# Patient Record
Sex: Female | Born: 1970 | Race: White | Hispanic: No | Marital: Married | Smoking: Never smoker
Health system: Southern US, Community
[De-identification: ages and names within clinical notes are randomized; demographics above are authoritative.]

## PROBLEM LIST (undated history)

## (undated) DIAGNOSIS — N814 Uterovaginal prolapse, unspecified: Secondary | ICD-10-CM

## (undated) DIAGNOSIS — D259 Leiomyoma of uterus, unspecified: Secondary | ICD-10-CM

## (undated) HISTORY — PX: TONSILLECTOMY: SUR1361

## (undated) HISTORY — PX: ABDOMINAL HYSTERECTOMY: SHX81

---

## 2019-04-09 ENCOUNTER — Emergency Department (HOSPITAL_BASED_OUTPATIENT_CLINIC_OR_DEPARTMENT_OTHER): Payer: 59

## 2019-04-09 ENCOUNTER — Emergency Department (HOSPITAL_BASED_OUTPATIENT_CLINIC_OR_DEPARTMENT_OTHER)
Admission: EM | Admit: 2019-04-09 | Discharge: 2019-04-09 | Disposition: A | Discharge status: Home / Self Care | Payer: 59 | Attending: Emergency Medicine | Admitting: Emergency Medicine

## 2019-04-09 ENCOUNTER — Other Ambulatory Visit: Payer: Self-pay

## 2019-04-09 ENCOUNTER — Encounter (HOSPITAL_BASED_OUTPATIENT_CLINIC_OR_DEPARTMENT_OTHER): Payer: Self-pay | Admitting: Emergency Medicine

## 2019-04-09 DIAGNOSIS — Z888 Allergy status to other drugs, medicaments and biological substances status: Secondary | ICD-10-CM | POA: Insufficient documentation

## 2019-04-09 DIAGNOSIS — K8020 Calculus of gallbladder without cholecystitis without obstruction: Secondary | ICD-10-CM | POA: Insufficient documentation

## 2019-04-09 DIAGNOSIS — R1011 Right upper quadrant pain: Secondary | ICD-10-CM | POA: Insufficient documentation

## 2019-04-09 DIAGNOSIS — R1013 Epigastric pain: Secondary | ICD-10-CM | POA: Diagnosis present

## 2019-04-09 HISTORY — DX: Leiomyoma of uterus, unspecified: D25.9

## 2019-04-09 HISTORY — DX: Uterovaginal prolapse, unspecified: N81.4

## 2019-04-09 LAB — URINALYSIS, ROUTINE W REFLEX MICROSCOPIC
Bilirubin Urine: NEGATIVE
Glucose, UA: NEGATIVE mg/dL
Ketones, ur: >80 mg/dL — AB
Leukocytes,Ua: NEGATIVE
Nitrite: NEGATIVE
Protein, ur: NEGATIVE mg/dL
Specific Gravity, Urine: 1.02 (ref 1.005–1.030)
pH: 6 (ref 5.0–8.0)

## 2019-04-09 LAB — CBC WITH DIFFERENTIAL/PLATELET
Abs Immature Granulocytes: 0.04 10*3/uL (ref 0.00–0.07)
Basophils Absolute: 0 10*3/uL (ref 0.0–0.1)
Basophils Relative: 0 %
Eosinophils Absolute: 0 10*3/uL (ref 0.0–0.5)
Eosinophils Relative: 0 %
HCT: 40.6 % (ref 36.0–46.0)
Hemoglobin: 13.3 g/dL (ref 12.0–15.0)
Immature Granulocytes: 0 %
Lymphocytes Relative: 11 %
Lymphs Abs: 1.3 10*3/uL (ref 0.7–4.0)
MCH: 28.4 pg (ref 26.0–34.0)
MCHC: 32.8 g/dL (ref 30.0–36.0)
MCV: 86.8 fL (ref 80.0–100.0)
Monocytes Absolute: 0.6 10*3/uL (ref 0.1–1.0)
Monocytes Relative: 5 %
Neutro Abs: 10.1 10*3/uL — ABNORMAL HIGH (ref 1.7–7.7)
Neutrophils Relative %: 84 %
Platelets: 261 10*3/uL (ref 150–400)
RBC: 4.68 MIL/uL (ref 3.87–5.11)
RDW: 12.9 % (ref 11.5–15.5)
WBC: 12 10*3/uL — ABNORMAL HIGH (ref 4.0–10.5)
nRBC: 0 % (ref 0.0–0.2)

## 2019-04-09 LAB — COMPREHENSIVE METABOLIC PANEL
ALT: 16 U/L (ref 0–44)
AST: 24 U/L (ref 15–41)
Albumin: 4.1 g/dL (ref 3.5–5.0)
Alkaline Phosphatase: 69 U/L (ref 38–126)
Anion gap: 8 (ref 5–15)
BUN: 13 mg/dL (ref 6–20)
CO2: 19 mmol/L — ABNORMAL LOW (ref 22–32)
Calcium: 9.4 mg/dL (ref 8.9–10.3)
Chloride: 109 mmol/L (ref 98–111)
Creatinine, Ser: 0.7 mg/dL (ref 0.44–1.00)
GFR calc Af Amer: >60 mL/min (ref >60)
GFR calc non Af Amer: >60 mL/min (ref >60)
Glucose, Bld: 126 mg/dL — ABNORMAL HIGH (ref 70–99)
Potassium: 3.5 mmol/L (ref 3.5–5.1)
Sodium: 136 mmol/L (ref 135–145)
Total Bilirubin: 0.8 mg/dL (ref 0.3–1.2)
Total Protein: 7.3 g/dL (ref 6.5–8.1)

## 2019-04-09 LAB — LIPASE, BLOOD: Lipase: 26 U/L (ref 11–51)

## 2019-04-09 LAB — URINALYSIS, MICROSCOPIC (REFLEX)

## 2019-04-09 LAB — PREGNANCY, URINE: Preg Test, Ur: NEGATIVE

## 2019-04-09 MED ORDER — ONDANSETRON HCL 4 MG/2ML IJ SOLN
4.0000 mg | Freq: Once | INTRAMUSCULAR | Status: AC
  Administered 2019-04-09: 4 mg via INTRAVENOUS
  Filled 2019-04-09: qty 2

## 2019-04-09 MED ORDER — ONDANSETRON HCL 4 MG PO TABS: 4.0000 mg | ORAL_TABLET | Freq: Three times a day (TID) | ORAL | 0 refills | Status: AC | PRN

## 2019-04-09 MED ORDER — ONDANSETRON HCL 4 MG/2ML IJ SOLN: 4.0000 mg | Freq: Four times a day (QID) | INTRAMUSCULAR | Status: DC | PRN

## 2019-04-09 MED ORDER — SODIUM CHLORIDE 0.9 % IV BOLUS: 1000.0000 mL | Freq: Once | INTRAVENOUS | Status: DC

## 2019-04-09 MED ORDER — OXYCODONE-ACETAMINOPHEN 5-325 MG PO TABS: 1.0000 | ORAL_TABLET | Freq: Four times a day (QID) | ORAL | 0 refills | Status: DC | PRN

## 2019-04-09 MED ORDER — FENTANYL CITRATE (PF) 100 MCG/2ML IJ SOLN
50.0000 ug | Freq: Once | INTRAMUSCULAR | Status: AC
  Administered 2019-04-09: 50 ug via INTRAVENOUS
  Filled 2019-04-09: qty 2

## 2019-04-09 MED ORDER — IOHEXOL 300 MG/ML  SOLN
100.0000 mL | Freq: Once | INTRAMUSCULAR | Status: AC | PRN
  Administered 2019-04-09: 100 mL via INTRAVENOUS

## 2019-04-09 MED ORDER — SODIUM CHLORIDE 0.9 % IV BOLUS
1000.0000 mL | Freq: Once | INTRAVENOUS | Status: AC
  Administered 2019-04-09: 1000 mL via INTRAVENOUS

## 2019-04-09 NOTE — ED Notes (Signed)
Patient transported to CT 

## 2019-04-09 NOTE — ED Triage Notes (Signed)
C/o sudden onset epigastric pain x ~12 hours with vomiting. Last episode of emesis ~10 hours ago. Denies having pain like this previously. Denies diarrhea/constipation.

## 2019-04-09 NOTE — ED Notes (Signed)
Pt verbalized understanding of dc instructions.

## 2019-04-09 NOTE — Discharge Instructions (Signed)
You were seen in the emergency department for upper abdominal pain and vomiting.  You had lab work CAT scan and ultrasound which showed that you had gallstones and gallbladder thickening.  This will likely require surgery.  Please contact [facility] for close follow-up.  If you experience fever or your pain does not resolve within 1 to 2 hours please return to the emergency department.

## 2019-04-09 NOTE — ED Provider Notes (Signed)
[facility] EMERGENCY DEPARTMENT Provider Note   CSN: [id number] Arrival date & time: 04/09/19  [id number]     History   Chief Complaint Chief Complaint  Patient presents with   Abdominal Pain    HPI Rebekah Garcia is a 48 y.o. female. She is presenting complaining of acute onset of subxiphoid abdominal pain that started about 12 hours ago. Initially was very nauseous and vomited multiple times. Pain was 10 out of 10 at onset. Subsiding somewhat. Decreased nausea. No fevers chills cough shortness of breath chest pain diarrhea constipation or urinary symptoms.     The history is provided by the patient.  Abdominal Pain Pain location:  Epigastric Pain quality: cramping   Pain radiates to:  Does not radiate Pain severity:  Severe Onset quality:  Sudden Duration:  12 hours Timing:  Constant Progression:  Improving Chronicity:  New Context: not previous surgeries, not recent illness, not recent travel and not trauma   Relieved by:  Nothing Worsened by:  Nothing Ineffective treatments:  Position changes Associated symptoms: nausea and vomiting   Associated symptoms: no chest pain, no chills, no constipation, no cough, no diarrhea, no dysuria, no fever, no hematemesis, no hematuria, no melena, no shortness of breath and no sore throat     Past Medical History:  Diagnosis Date   Uterine fibroid    Uterine prolapse     There are no active problems to display for this patient.   History reviewed. No pertinent surgical history.   OB History   No obstetric history on file.      Home Medications    Prior to Admission medications   Medication Sig Start Date End Date Taking? Authorizing Provider  Multiple Vitamin (MULTIVITAMIN) tablet Take 1 tablet by mouth daily.   Yes [provider]    Family History No family history on file.  Social History Social History   Tobacco Use   Smoking status: Never Smoker   Smokeless tobacco: Never Used    Substance Use Topics   Alcohol use: Never    Frequency: Never   Drug use: Never     Allergies   Octacaine   Review of Systems Review of Systems  Constitutional: Negative for chills and fever.  HENT: Negative for sore throat.   Eyes: Negative for visual disturbance.  Respiratory: Negative for cough and shortness of breath.   Cardiovascular: Negative for chest pain.  Gastrointestinal: Positive for abdominal pain, nausea and vomiting. Negative for constipation, diarrhea, hematemesis and melena.  Genitourinary: Negative for dysuria and hematuria.  Musculoskeletal: Negative for back pain.  Skin: Negative for rash.  Neurological: Negative for headaches.     Physical Exam Updated Vital Signs BP 114/71 (BP Location: Right Arm)    Pulse 85    Temp 98.3 F (36.8 C) (Oral)    Resp 20    Ht 5\' 4"  (1.626 m)    Wt 63.5 kg    LMP 04/09/2019    SpO2 100%    BMI 24.03 kg/m   Physical Exam Vitals signs and nursing note reviewed.  Constitutional:      General: She is not in acute distress.    Appearance: She is well-developed.  HENT:     Head: Normocephalic and atraumatic.  Eyes:     Conjunctiva/sclera: Conjunctivae normal.  Neck:     Musculoskeletal: Neck supple.  Cardiovascular:     Rate and Rhythm: Normal rate and regular rhythm.     Heart sounds: No murmur.  Pulmonary:  Effort: Pulmonary effort is normal. No respiratory distress.     Breath sounds: Normal breath sounds.  Abdominal:     Palpations: Abdomen is soft.     Tenderness: There is no abdominal tenderness. There is no guarding or rebound. Negative signs include Murphy's sign.  Musculoskeletal: Normal range of motion.     Right lower leg: No edema.     Left lower leg: No edema.  Skin:    General: Skin is warm and dry.     Capillary Refill: Capillary refill takes less than 2 seconds.  Neurological:     General: No focal deficit present.     Mental Status: She is alert.      ED Treatments / Results   Labs (all labs ordered are listed, but only abnormal results are displayed) Labs Reviewed  CBC WITH DIFFERENTIAL/PLATELET - Abnormal; Notable for the following components:      Result Value   WBC 12.0 (*)    Neutro Abs 10.1 (*)    All other components within normal limits  COMPREHENSIVE METABOLIC PANEL - Abnormal; Notable for the following components:   CO2 19 (*)    Glucose, Bld 126 (*)    All other components within normal limits  URINALYSIS, ROUTINE W REFLEX MICROSCOPIC - Abnormal; Notable for the following components:   Hgb urine dipstick SMALL (*)    Ketones, ur >80 (*)    All other components within normal limits  URINALYSIS, MICROSCOPIC (REFLEX) - Abnormal; Notable for the following components:   Bacteria, UA MANY (*)    All other components within normal limits  LIPASE, BLOOD  PREGNANCY, URINE    EKG EKG Interpretation  Date/Time:  Sunday April 09 2019 06:46:00 EST Ventricular Rate:  81 PR Interval:    QRS Duration: 86 QT Interval:  407 QTC Calculation: 473 R Axis:   82 Text Interpretation: Sinus rhythm Right atrial enlargement Confirmed by Thayer Jew [phone]) on 04/09/2019 6:48:14 AM   Radiology Ct Abdomen Pelvis W Contrast  Result Date: 04/09/2019 CLINICAL DATA:  Epigastric pain with nausea and vomiting EXAM: CT ABDOMEN AND PELVIS WITH CONTRAST TECHNIQUE: Multidetector CT imaging of the abdomen and pelvis was performed using the standard protocol following bolus administration of intravenous contrast. CONTRAST:  170mL OMNIPAQUE IOHEXOL 300 MG/ML  SOLN COMPARISON:  None. FINDINGS: Lower chest: Lung bases are clear. Hepatobiliary: No focal liver lesions are evident. The gallbladder wall appears thickened with apparent pericholecystic fluid. No gallstones are appreciable by CT. There is no appreciable biliary duct dilatation. Pancreas: There is no pancreatic mass or inflammatory focus. Spleen: No splenic lesions are evident. Adrenals/Urinary Tract: Adrenals  bilaterally appear normal. There is a cyst in the upper pole of the left kidney measuring 1.5 x 1.3 cm. There is a 5 mm cyst in the posterior mid right kidney. There is a 7 mm cyst in the lower pole left kidney. There is no appreciable hydronephrosis on either side. There is a 1 mm calculus in the lower pole of the right kidney. There is no appreciable ureteral calculus on either side. Urinary bladder is midline with wall thickness within normal limits. Stomach/Bowel: There is no appreciable bowel wall or mesenteric thickening. Terminal ileum appears normal. There is no evident bowel obstruction. There is no free air or portal venous air. Vascular/Lymphatic: There is no abdominal aortic aneurysm. No vascular lesions are evident. There is no adenopathy in the abdomen or pelvis. Reproductive: Uterus is anteverted. Uterus is enlarged diffusely measuring 12.3 x 9.7 x 10.8  cm. There are mass lesions throughout the uterus consistent with diffuse leiomyomatous change. There is prominent periuterine vascularity. There is a follicle in the right ovary measuring 1.7 x 1.6 cm. Beyond this follicle, there is no extrauterine pelvic mass. Other: Appendix appears normal. No evident abscess or ascites in the abdomen or pelvis. Musculoskeletal: There is a hemangioma in the L4 vertebral body. No blastic or lytic bone lesions evident. No intramuscular or abdominal wall lesion. IMPRESSION: 1. Apparent gallbladder wall thickening and pericholecystic fluid. A degree of cholecystitis is suspected. Correlation with gallbladder ultrasound advised. 2. Enlarged uterus with diffuse leiomyomatous change within the uterus. 3. Prominent periuterine vascularity. Question a degree of pelvic congestion syndrome. 4. Nonobstructing 1 mm calculus lower pole left kidney. No hydronephrosis or ureteral calculus on either side. Urinary bladder wall thickness normal. 5. Appendix appears normal. No bowel obstruction. No abscess in the abdomen or pelvis.  Electronically Signed   By: Lowella Grip III M.D.   On: 04/09/2019 09:16   US Abdomen Limited Ruq  Result Date: 04/09/2019 CLINICAL DATA:  Pain with nausea and vomiting EXAM: ULTRASOUND ABDOMEN LIMITED RIGHT UPPER QUADRANT COMPARISON:  CT abdomen and pelvis April 09, 2019 FINDINGS: Gallbladder: Within the gallbladder there are echogenic foci which move and shadow consistent with cholelithiasis. Largest gallstone measures 1.5 cm in length. There is gallbladder wall thickening and mild pericholecystic fluid. Patient is tender over the gallbladder. Common bile duct: Diameter: 3 mm. No intrahepatic or extrahepatic biliary duct dilatation. Liver: No focal lesion identified. Within normal limits in parenchymal echogenicity. Portal vein is patent on color Doppler imaging with normal direction of blood flow towards the liver. Other: None. IMPRESSION: Findings indicative of acute cholecystitis. Study otherwise unremarkable. Electronically Signed   By: Lowella Grip III M.D.   On: 04/09/2019 09:51    Procedures Procedures (including critical care time)  Medications Ordered in ED Medications  sodium chloride 0.9 % bolus 1,000 mL (has no administration in time range)  ondansetron (ZOFRAN) injection 4 mg (has no administration in time range)  sodium chloride 0.9 % bolus 1,000 mL (has no administration in time range)  fentaNYL (SUBLIMAZE) injection 50 mcg (has no administration in time range)  ondansetron (ZOFRAN) injection 4 mg (has no administration in time range)     Initial Impression / Assessment and Plan / ED Course  I have reviewed the triage vital signs and the nursing notes.  Pertinent labs & imaging results that were available during my care of the patient were reviewed by me and considered in my medical decision making (see chart for details).  Clinical Course as of Apr 08 1718  Nancy Fetter Apr 09, 2019  07021 Healthy 48 year old female here with sudden onset of subxiphoid epigastric pain  vomiting. Differential includes gastritis, peptic ulcer disease, cholelithiasis, cholecystitis, obstruction, gastroenteritis. Less likely ACS.    [MB]  [id number] Patient CT showing evidence of gallbladder thickening.  I have ordered a right upper quadrant ultrasound and informed patient of results.  As far as the findings around her uterus she says she is aware of this and follows with GYN for it.   [MB]  1019 Discussed the case with Dr. Marlou Starks was along general surgery.  He said the patient could be admitted to his service for at [facility] long or she can be discharged and follow-up with them in the clinic if she so chooses.  Patient states she has some important things dated tomorrow so she would like to go home.  We talked about  indications for return.  Will prescribe her some pain medicine for discharge.   [MB]    Clinical Course User Index [MB] Hayden Rasmussen, MD        Final Clinical Impressions(s) / ED Diagnoses   Final diagnoses:  RUQ abdominal pain  Calculus of gallbladder without cholecystitis without obstruction    ED Discharge Orders         Ordered    oxyCODONE-acetaminophen (PERCOCET/ROXICET) 5-325 MG tablet  Every 6 hours PRN     04/09/19 1023    ondansetron (ZOFRAN) 4 MG tablet  Every 8 hours PRN     04/09/19 1023           Hayden Rasmussen, MD 04/09/19 1719

## 2019-04-20 ENCOUNTER — Ambulatory Visit: Payer: Self-pay | Admitting: Surgery

## 2019-04-20 NOTE — H&P (View-Only) (Signed)
Surgical H&P  CC: cholecystitis  HPI: this is a very pleasant and otherwise healthy 48 year old woman who presented to [facility] emergency room about 2 weeks ago with acute onset severe epigastric pain, nausea, workup was notable for acute calculus cholecystitis confirmed by ultrasound.  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  She was discharged with pain and nausea medication but no antibiotics by her report, and has been on a strict fat-free diet since then.  After 3 or 4 days her symptoms subsided, she is now not having any nausea or pain.  No prior abdominal surgeries.  Allergies  Allergen Reactions   Octacaine Anaphylaxis    Past Medical History:  Diagnosis Date   Uterine fibroid    Uterine prolapse     No past surgical history on file.  No family history on file.  Social History   Socioeconomic History   Marital status: Married    Spouse name: Not on file   Number of children: Not on file   Years of education: Not on file   Highest education level: Not on file  Occupational History   Not on file  Tobacco Use   Smoking status: Never Smoker   Smokeless tobacco: Never Used  Substance and Sexual Activity   Alcohol use: Never   Drug use: Never   Sexual activity: Not on file  Other Topics Concern   Not on file  Social History Narrative   Not on file   Social Determinants of Health   Financial Resource Strain:    Difficulty of Paying Living Expenses: Not on file  Food Insecurity:    Worried About [place] in the Last Year: Not on file   Ran Out of Food in the Last Year: Not on file  Transportation Needs:    Lack of Transportation (Medical): Not on file   Lack of Transportation (Non-Medical): Not on file  Physical Activity:    Days of Exercise per Week: Not on file   Minutes of Exercise per Session: Not on file  Stress:    Feeling of Stress : Not on file  Social Connections:    Frequency of Communication with Friends and Family: Not on file   Frequency of Social Gatherings with Friends and Family: Not on file   Attends Religious Services: Not on file   Active Member of Clubs or Organizations: Not on file   Attends Archivist Meetings: Not on file   Marital Status: Not on file    Current Outpatient Medications on File Prior to Visit  Medication Sig Dispense Refill   Multiple Vitamin (MULTIVITAMIN) tablet Take 1 tablet by mouth daily.     ondansetron (ZOFRAN) 4 MG tablet Take 1 tablet (4 mg total) by mouth every 8 (eight) hours as needed for nausea or vomiting. 15 tablet 0   oxyCODONE-acetaminophen (PERCOCET/ROXICET) 5-325 MG tablet Take 1-2 tablets by mouth every 6 (six) hours as needed for severe pain. 15 tablet 0   No current facility-administered medications on file prior to visit.    Review of Systems: a complete, 10pt review of systems was completed with pertinent positives and negatives as documented in the HPI  Physical Exam: Vitals  Weight: 135.2 lb   Height: 64 in  Body Surface Area: 1.66 m   Body Mass Index: 23.21 kg/m   Temp.: 98 F  Pulse: 102 (Regular)    BP: 124/86 (Sitting, Left Arm, Standard)  Gen: alert and well appearing Eye: extraocular motion intact, no scleral icterus ENT: moist mucus membranes, dentition intact Neck: no mass or thyromegaly Chest: unlabored respirations, symmetrical air entry, clear bilaterally CV: regular rate and rhythm, no pedal edema Abdomen: soft, nontender, nondistended. No mass or organomegaly MSK: strength symmetrical throughout, no deformity Neuro: grossly intact, normal gait Psych: normal mood and affect, appropriate insight Skin: warm and dry, no rash or lesion on limited exam   CBC Latest Ref Rng & Units 04/09/2019  WBC 4.0 - 10.5 K/uL 12.0(H)  Hemoglobin 12.0 - 15.0 g/dL 13.3  Hematocrit 36.0 - 46.0 % 40.6  Platelets 150 - 400  K/uL 261    CMP Latest Ref Rng & Units 04/09/2019  Glucose 70 - 99 mg/dL 126(H)  BUN 6 - 20 mg/dL 13  Creatinine 0.44 - 1.00 mg/dL 0.70  Sodium 135 - 145 mmol/L 136  Potassium 3.5 - 5.1 mmol/L 3.5  Chloride 98 - 111 mmol/L 109  CO2 22 - 32 mmol/L 19(L)  Calcium 8.9 - 10.3 mg/dL 9.4  Total Protein 6.5 - 8.1 g/dL 7.3  Total Bilirubin 0.3 - 1.2 mg/dL 0.8  Alkaline Phos 38 - 126 U/L 69  AST 15 - 41 U/L 24  ALT 0 - 44 U/L 16    No results found for: INR, PROTIME  Imaging: No results found.   A/P: CHOLECYSTITIS, ACUTE WITH CHOLELITHIASIS (K80.00) Story: Fortunately the patient's symptoms have significantly improved, she is able tolerate a diet and has been functional. I recommend proceeding with laparoscopic / robotic cholecystectomy with possible cholangiogram. Discussed risks of surgery including bleeding, pain, scarring, intraabdominal injury specifically to the common bile duct and sequelae, bile leak, conversion to open surgery, failure to resolve symptoms, blood clots/ pulmonary embolus, heart attack, pneumonia, stroke, death. Questions welcomed and answered to patient's satisfaction. We will try to schedule her as soon as possible. She was given strict instructions to either contact us or report to the [facility] or [facility] ER if she develops recurrent symptoms.  There are no problems to display for this patient.      Romana Juniper, MD [facility], [place]  See AMION to contact appropriate on-call provider

## 2019-04-20 NOTE — Patient Instructions (Addendum)
DUE TO COVID-19 ONLY ONE VISITOR IS ALLOWED TO COME WITH YOU AND STAY IN THE WAITING ROOM ONLY DURING PRE OP AND PROCEDURE DAY OF SURGERY. THE 1 VISITOR MAY VISIT WITH YOU AFTER SURGERY IN YOUR PRIVATE ROOM DURING VISITING HOURS ONLY!    ONCE YOUR COVID TEST IS COMPLETED, PLEASE BEGIN THE QUARANTINE INSTRUCTIONS AS OUTLINED IN YOUR HANDOUT.                [facility]    Your procedure is scheduled on: 04/25/19   Report to [facility] Main  Entrance   Report to admitting at   1:10 PM     Call this number if you have problems the morning of surgery [phone]    Remember: Do not eat food or drink liquids :After Midnight.   BRUSH YOUR TEETH MORNING OF SURGERY AND RINSE YOUR MOUTH OUT, NO CHEWING GUM CANDY OR MINTS.     Take these medicines the morning of surgery with A SIP OF WATER: none                                 You may not have any metal on your body including hair pins and              piercings  Do not wear jewelry, make-up, lotions, powders or perfumes, deodorant             Do not wear nail polish on your fingernails.  Do not shave  48 hours prior to surgery.             Do not bring valuables to the hospital. [place].  Contacts, dentures or bridgework may not be worn into surgery.       Patients discharged the day of surgery will not be allowed to drive home.   IF YOU ARE HAVING SURGERY AND GOING HOME THE SAME DAY, YOU MUST HAVE AN ADULT TO DRIVE YOU HOME AND BE WITH YOU FOR 24 HOURS.  YOU MAY GO HOME BY TAXI OR UBER OR ORTHERWISE, BUT AN ADULT MUST ACCOMPANY YOU HOME AND STAY WITH YOU FOR 24 HOURS.  Name and phone number of your driver:  Special Instructions: N/A              Please read over the following fact sheets you were given: _____________________________________________________________________             [facility] - Preparing for Surgery Before surgery, you can play an important  role.   Because skin is not sterile, your skin needs to be as free of germs as possible.   You can reduce the number of germs on your skin by washing with CHG (chlorahexidine gluconate) soap before surgery.   CHG is an antiseptic cleaner which kills germs and bonds with the skin to continue killing germs even after washing. Please DO NOT use if you have an allergy to CHG or antibacterial soaps.   If your skin becomes reddened/irritated stop using the CHG and inform your nurse when you arrive at Short Stay. Do not shave (including legs and underarms) for at least 48 hours prior to the first CHG shower.   Please follow these instructions carefully:  1.  Shower with CHG Soap the night before surgery and the  morning of Surgery.  2.  If you choose to wash your hair, wash your hair first as usual with your  normal  shampoo.  3.  After you shampoo, rinse your hair and body thoroughly to remove the  shampoo.                                        4.  Use CHG as you would any other liquid soap.  You can apply chg directly  to the skin and wash                       Gently with a scrungie or clean washcloth.  5.  Apply the CHG Soap to your body ONLY FROM THE NECK DOWN.   Do not use on face/ open                           Wound or open sores. Avoid contact with eyes, ears mouth and genitals (private parts).                       Wash face,  Genitals (private parts) with your normal soap.             6.  Wash thoroughly, paying special attention to the area where your surgery  will be performed.  7.  Thoroughly rinse your body with warm water from the neck down.  8.  DO NOT shower/wash with your normal soap after using and rinsing off  the CHG Soap.             9.  Pat yourself dry with a clean towel.            10.  Wear clean pajamas.            11.  Place clean sheets on your bed the night of your first shower and do not  sleep with pets. Day of Surgery : Do not apply any lotions/deodorants the  morning of surgery.  Please wear clean clothes to the hospital/surgery center.  FAILURE TO FOLLOW THESE INSTRUCTIONS MAY RESULT IN THE CANCELLATION OF YOUR SURGERY PATIENT SIGNATURE_________________________________  NURSE SIGNATURE__________________________________  ________________________________________________________________________

## 2019-04-20 NOTE — H&P (Signed)
Surgical H&P  CC: cholecystitis  HPI: this is a very pleasant and otherwise healthy 48 year old woman who presented to [facility] emergency room about 2 weeks ago with acute onset severe epigastric pain, nausea, workup was notable for acute calculus cholecystitis confirmed by ultrasound.  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  She was discharged with pain and nausea medication but no antibiotics by her report, and has been on a strict fat-free diet since then.  After 3 or 4 days her symptoms subsided, she is now not having any nausea or pain.  No prior abdominal surgeries.  Allergies  Allergen Reactions   Octacaine Anaphylaxis    Past Medical History:  Diagnosis Date   Uterine fibroid    Uterine prolapse     No past surgical history on file.  No family history on file.  Social History   Socioeconomic History   Marital status: Married    Spouse name: Not on file   Number of children: Not on file   Years of education: Not on file   Highest education level: Not on file  Occupational History   Not on file  Tobacco Use   Smoking status: Never Smoker   Smokeless tobacco: Never Used  Substance and Sexual Activity   Alcohol use: Never   Drug use: Never   Sexual activity: Not on file  Other Topics Concern   Not on file  Social History Narrative   Not on file   Social Determinants of Health   Financial Resource Strain:    Difficulty of Paying Living Expenses: Not on file  Food Insecurity:    Worried About [place] in the Last Year: Not on file   Ran Out of Food in the Last Year: Not on file  Transportation Needs:    Lack of Transportation (Medical): Not on file   Lack of Transportation (Non-Medical): Not on file  Physical Activity:    Days of Exercise per Week: Not on file   Minutes of Exercise per Session: Not on file  Stress:    Feeling of Stress : Not on file  Social Connections:    Frequency of Communication with Friends and Family: Not on file   Frequency of Social Gatherings with Friends and Family: Not on file   Attends Religious Services: Not on file   Active Member of Clubs or Organizations: Not on file   Attends Archivist Meetings: Not on file   Marital Status: Not on file    Current Outpatient Medications on File Prior to Visit  Medication Sig Dispense Refill   Multiple Vitamin (MULTIVITAMIN) tablet Take 1 tablet by mouth daily.     ondansetron (ZOFRAN) 4 MG tablet Take 1 tablet (4 mg total) by mouth every 8 (eight) hours as needed for nausea or vomiting. 15 tablet 0   oxyCODONE-acetaminophen (PERCOCET/ROXICET) 5-325 MG tablet Take 1-2 tablets by mouth every 6 (six) hours as needed for severe pain. 15 tablet 0   No current facility-administered medications on file prior to visit.    Review of Systems: a complete, 10pt review of systems was completed with pertinent positives and negatives as documented in the HPI  Physical Exam: Vitals  Weight: 135.2 lb   Height: 64 in  Body Surface Area: 1.66 m   Body Mass Index: 23.21 kg/m   Temp.: 98 F  Pulse: 102 (Regular)    BP: 124/86 (Sitting, Left Arm, Standard)  Gen: alert and well appearing Eye: extraocular motion intact, no scleral icterus ENT: moist mucus membranes, dentition intact Neck: no mass or thyromegaly Chest: unlabored respirations, symmetrical air entry, clear bilaterally CV: regular rate and rhythm, no pedal edema Abdomen: soft, nontender, nondistended. No mass or organomegaly MSK: strength symmetrical throughout, no deformity Neuro: grossly intact, normal gait Psych: normal mood and affect, appropriate insight Skin: warm and dry, no rash or lesion on limited exam   CBC Latest Ref Rng & Units 04/09/2019  WBC 4.0 - 10.5 K/uL 12.0(H)  Hemoglobin 12.0 - 15.0 g/dL 13.3  Hematocrit 36.0 - 46.0 % 40.6  Platelets 150 - 400  K/uL 261    CMP Latest Ref Rng & Units 04/09/2019  Glucose 70 - 99 mg/dL 126(H)  BUN 6 - 20 mg/dL 13  Creatinine 0.44 - 1.00 mg/dL 0.70  Sodium 135 - 145 mmol/L 136  Potassium 3.5 - 5.1 mmol/L 3.5  Chloride 98 - 111 mmol/L 109  CO2 22 - 32 mmol/L 19(L)  Calcium 8.9 - 10.3 mg/dL 9.4  Total Protein 6.5 - 8.1 g/dL 7.3  Total Bilirubin 0.3 - 1.2 mg/dL 0.8  Alkaline Phos 38 - 126 U/L 69  AST 15 - 41 U/L 24  ALT 0 - 44 U/L 16    No results found for: INR, PROTIME  Imaging: No results found.   A/P: CHOLECYSTITIS, ACUTE WITH CHOLELITHIASIS (K80.00) Story: Fortunately the patient's symptoms have significantly improved, she is able tolerate a diet and has been functional. I recommend proceeding with laparoscopic / robotic cholecystectomy with possible cholangiogram. Discussed risks of surgery including bleeding, pain, scarring, intraabdominal injury specifically to the common bile duct and sequelae, bile leak, conversion to open surgery, failure to resolve symptoms, blood clots/ pulmonary embolus, heart attack, pneumonia, stroke, death. Questions welcomed and answered to patient's satisfaction. We will try to schedule her as soon as possible. She was given strict instructions to either contact us or report to the [facility] or [facility] ER if she develops recurrent symptoms.  There are no problems to display for this patient.      Romana Juniper, MD [facility], [place]  See AMION to contact appropriate on-call provider

## 2019-04-21 ENCOUNTER — Encounter (HOSPITAL_COMMUNITY): Payer: Self-pay

## 2019-04-21 ENCOUNTER — Encounter (HOSPITAL_COMMUNITY)
Admission: RE | Admit: 2019-04-21 | Discharge: 2019-04-21 | Disposition: A | Discharge status: Home / Self Care | Payer: 59 | Source: Ambulatory Visit | Attending: Surgery | Admitting: Surgery

## 2019-04-21 ENCOUNTER — Other Ambulatory Visit (HOSPITAL_COMMUNITY)
Admission: RE | Admit: 2019-04-21 | Discharge: 2019-04-21 | Disposition: A | Discharge status: Home / Self Care | Payer: 59 | Source: Ambulatory Visit | Attending: Surgery | Admitting: Surgery

## 2019-04-21 ENCOUNTER — Other Ambulatory Visit: Payer: Self-pay

## 2019-04-21 DIAGNOSIS — Z20828 Contact with and (suspected) exposure to other viral communicable diseases: Secondary | ICD-10-CM | POA: Diagnosis not present

## 2019-04-21 DIAGNOSIS — Z01812 Encounter for preprocedural laboratory examination: Secondary | ICD-10-CM | POA: Insufficient documentation

## 2019-04-21 LAB — COMPREHENSIVE METABOLIC PANEL
ALT: 15 U/L (ref 0–44)
AST: 18 U/L (ref 15–41)
Albumin: 4.1 g/dL (ref 3.5–5.0)
Alkaline Phosphatase: 72 U/L (ref 38–126)
Anion gap: 9 (ref 5–15)
BUN: 10 mg/dL (ref 6–20)
CO2: 26 mmol/L (ref 22–32)
Calcium: 9.4 mg/dL (ref 8.9–10.3)
Chloride: 104 mmol/L (ref 98–111)
Creatinine, Ser: 0.75 mg/dL (ref 0.44–1.00)
GFR calc Af Amer: >60 mL/min (ref >60)
GFR calc non Af Amer: >60 mL/min (ref >60)
Glucose, Bld: 91 mg/dL (ref 70–99)
Potassium: 4 mmol/L (ref 3.5–5.1)
Sodium: 139 mmol/L (ref 135–145)
Total Bilirubin: 0.7 mg/dL (ref 0.3–1.2)
Total Protein: 7 g/dL (ref 6.5–8.1)

## 2019-04-21 LAB — CBC WITH DIFFERENTIAL/PLATELET
Abs Immature Granulocytes: 0.02 10*3/uL (ref 0.00–0.07)
Basophils Absolute: 0 10*3/uL (ref 0.0–0.1)
Basophils Relative: 0 %
Eosinophils Absolute: 0.1 10*3/uL (ref 0.0–0.5)
Eosinophils Relative: 1 %
HCT: 42.1 % (ref 36.0–46.0)
Hemoglobin: 13.6 g/dL (ref 12.0–15.0)
Immature Granulocytes: 0 %
Lymphocytes Relative: 34 %
Lymphs Abs: 1.9 10*3/uL (ref 0.7–4.0)
MCH: 28.5 pg (ref 26.0–34.0)
MCHC: 32.3 g/dL (ref 30.0–36.0)
MCV: 88.1 fL (ref 80.0–100.0)
Monocytes Absolute: 0.4 10*3/uL (ref 0.1–1.0)
Monocytes Relative: 8 %
Neutro Abs: 3.2 10*3/uL (ref 1.7–7.7)
Neutrophils Relative %: 57 %
Platelets: 309 10*3/uL (ref 150–400)
RBC: 4.78 MIL/uL (ref 3.87–5.11)
RDW: 12.6 % (ref 11.5–15.5)
WBC: 5.7 10*3/uL (ref 4.0–10.5)
nRBC: 0 % (ref 0.0–0.2)

## 2019-04-21 NOTE — Progress Notes (Signed)
PCP - Dr. Hendricks Milo Cardiologist - none  Chest x-ray - no EKG - 04/09/19 Stress Test - no ECHO - no Cardiac Cath - no  Sleep Study - no CPAP -   Fasting Blood Sugar - NA Checks Blood Sugar _____ times a day  Blood Thinner Instructions:NA Aspirin Instructions: Last Dose:  Anesthesia review:   Patient denies shortness of breath, fever, cough and chest pain at PAT appointment yes  Patient verbalized understanding of instructions that were given to them at the PAT appointment. Patient was also instructed that they will need to review over the PAT instructions again at home before surgery. yes

## 2019-04-22 LAB — NOVEL CORONAVIRUS, NAA (HOSP ORDER, SEND-OUT TO REF LAB; TAT 18-24 HRS): SARS-CoV-2, NAA: NOT DETECTED

## 2019-04-25 ENCOUNTER — Encounter (HOSPITAL_COMMUNITY)
Admission: RE | Disposition: A | Discharge status: Home / Self Care | Payer: Self-pay | Source: Home / Self Care | Attending: Surgery

## 2019-04-25 ENCOUNTER — Ambulatory Visit (HOSPITAL_COMMUNITY): Payer: 59 | Admitting: Physician Assistant

## 2019-04-25 ENCOUNTER — Other Ambulatory Visit: Payer: Self-pay

## 2019-04-25 ENCOUNTER — Ambulatory Visit (HOSPITAL_COMMUNITY): Payer: 59 | Admitting: Anesthesiology

## 2019-04-25 ENCOUNTER — Encounter (HOSPITAL_COMMUNITY): Payer: Self-pay | Admitting: Surgery

## 2019-04-25 ENCOUNTER — Ambulatory Visit (HOSPITAL_COMMUNITY)
Admission: RE | Admit: 2019-04-25 | Discharge: 2019-04-25 | Disposition: A | Discharge status: Home / Self Care | Payer: 59 | Attending: Surgery | Admitting: Surgery

## 2019-04-25 DIAGNOSIS — K8012 Calculus of gallbladder with acute and chronic cholecystitis without obstruction: Secondary | ICD-10-CM | POA: Diagnosis not present

## 2019-04-25 DIAGNOSIS — K8000 Calculus of gallbladder with acute cholecystitis without obstruction: Secondary | ICD-10-CM | POA: Diagnosis present

## 2019-04-25 HISTORY — PX: CHOLECYSTECTOMY: SHX55

## 2019-04-25 LAB — PREGNANCY, URINE: Preg Test, Ur: NEGATIVE

## 2019-04-25 SURGERY — LAPAROSCOPIC CHOLECYSTECTOMY WITH INTRAOPERATIVE CHOLANGIOGRAM
Anesthesia: General | Site: Abdomen

## 2019-04-25 MED ORDER — ACETAMINOPHEN 500 MG PO TABS
1000.0000 mg | ORAL_TABLET | Freq: Once | ORAL | Status: AC
  Administered 2019-04-25: 1000 mg via ORAL
  Filled 2019-04-25: qty 2

## 2019-04-25 MED ORDER — BUPIVACAINE HCL (PF) 0.25 % IJ SOLN
INTRAMUSCULAR | Status: AC
  Filled 2019-04-25: qty 30

## 2019-04-25 MED ORDER — ACETAMINOPHEN 650 MG RE SUPP
650.0000 mg | RECTAL | Status: DC | PRN
  Filled 2019-04-25: qty 1

## 2019-04-25 MED ORDER — ONDANSETRON HCL 4 MG/2ML IJ SOLN
INTRAMUSCULAR | Status: DC | PRN
  Administered 2019-04-25: 4 mg via INTRAVENOUS

## 2019-04-25 MED ORDER — FENTANYL CITRATE (PF) 100 MCG/2ML IJ SOLN: 25.0000 ug | INTRAMUSCULAR | Status: DC | PRN

## 2019-04-25 MED ORDER — OXYCODONE HCL 5 MG PO TABS
5.0000 mg | ORAL_TABLET | ORAL | Status: DC | PRN
  Administered 2019-04-25: 5 mg via ORAL

## 2019-04-25 MED ORDER — 0.9 % SODIUM CHLORIDE (POUR BTL) OPTIME
TOPICAL | Status: DC | PRN
  Administered 2019-04-25: 1000 mL

## 2019-04-25 MED ORDER — BUPIVACAINE HCL 0.25 % IJ SOLN
INTRAMUSCULAR | Status: DC | PRN
  Administered 2019-04-25: 30 mL

## 2019-04-25 MED ORDER — OXYCODONE HCL 5 MG PO TABS
ORAL_TABLET | ORAL | Status: AC
  Filled 2019-04-25: qty 1

## 2019-04-25 MED ORDER — PROPOFOL 10 MG/ML IV BOLUS
INTRAVENOUS | Status: DC | PRN
  Administered 2019-04-25: 150 mg via INTRAVENOUS

## 2019-04-25 MED ORDER — CHLORHEXIDINE GLUCONATE 4 % EX LIQD: 60.0000 mL | Freq: Once | CUTANEOUS | Status: DC

## 2019-04-25 MED ORDER — ACETAMINOPHEN 325 MG PO TABS: 650.0000 mg | ORAL_TABLET | ORAL | Status: DC | PRN

## 2019-04-25 MED ORDER — MIDAZOLAM HCL 2 MG/2ML IJ SOLN
INTRAMUSCULAR | Status: AC
  Filled 2019-04-25: qty 2

## 2019-04-25 MED ORDER — INDOCYANINE GREEN 25 MG IV SOLR: 2.5000 mg | Freq: Once | INTRAVENOUS | Status: DC

## 2019-04-25 MED ORDER — GABAPENTIN 300 MG PO CAPS
300.0000 mg | ORAL_CAPSULE | ORAL | Status: AC
  Administered 2019-04-25: 300 mg via ORAL
  Filled 2019-04-25: qty 1

## 2019-04-25 MED ORDER — ROCURONIUM BROMIDE 10 MG/ML (PF) SYRINGE
PREFILLED_SYRINGE | INTRAVENOUS | Status: DC | PRN
  Administered 2019-04-25: 60 mg via INTRAVENOUS

## 2019-04-25 MED ORDER — SUGAMMADEX SODIUM 500 MG/5ML IV SOLN: INTRAVENOUS | Status: DC | PRN

## 2019-04-25 MED ORDER — SCOPOLAMINE 1 MG/3DAYS TD PT72
1.0000 | MEDICATED_PATCH | TRANSDERMAL | Status: DC
  Administered 2019-04-25: 1.5 mg via TRANSDERMAL
  Filled 2019-04-25: qty 1

## 2019-04-25 MED ORDER — CELECOXIB 200 MG PO CAPS
400.0000 mg | ORAL_CAPSULE | Freq: Once | ORAL | Status: AC
  Administered 2019-04-25: 400 mg via ORAL
  Filled 2019-04-25: qty 2

## 2019-04-25 MED ORDER — BUPIVACAINE HCL (PF) 0.5 % IJ SOLN
INTRAMUSCULAR | Status: AC
  Filled 2019-04-25: qty 30

## 2019-04-25 MED ORDER — TRAMADOL HCL 50 MG PO TABS: 50.0000 mg | ORAL_TABLET | Freq: Four times a day (QID) | ORAL | 0 refills | Status: AC | PRN

## 2019-04-25 MED ORDER — PHENYLEPHRINE 40 MCG/ML (10ML) SYRINGE FOR IV PUSH (FOR BLOOD PRESSURE SUPPORT)
PREFILLED_SYRINGE | INTRAVENOUS | Status: DC | PRN
  Administered 2019-04-25: 40 ug via INTRAVENOUS

## 2019-04-25 MED ORDER — SODIUM CHLORIDE 0.9% FLUSH: 3.0000 mL | INTRAVENOUS | Status: DC | PRN

## 2019-04-25 MED ORDER — FENTANYL CITRATE (PF) 250 MCG/5ML IJ SOLN
INTRAMUSCULAR | Status: AC
  Filled 2019-04-25: qty 5

## 2019-04-25 MED ORDER — BUPIVACAINE LIPOSOME 1.3 % IJ SUSP
20.0000 mL | Freq: Once | INTRAMUSCULAR | Status: DC
  Filled 2019-04-25: qty 20

## 2019-04-25 MED ORDER — SODIUM CHLORIDE 0.9 % IV SOLN: 250.0000 mL | INTRAVENOUS | Status: DC | PRN

## 2019-04-25 MED ORDER — MIDAZOLAM HCL 5 MG/5ML IJ SOLN
INTRAMUSCULAR | Status: DC | PRN
  Administered 2019-04-25: 2 mg via INTRAVENOUS

## 2019-04-25 MED ORDER — FENTANYL CITRATE (PF) 100 MCG/2ML IJ SOLN
INTRAMUSCULAR | Status: DC | PRN
  Administered 2019-04-25: 100 ug via INTRAVENOUS
  Administered 2019-04-25 (×3): 50 ug via INTRAVENOUS

## 2019-04-25 MED ORDER — BUPIVACAINE LIPOSOME 1.3 % IJ SUSP
INTRAMUSCULAR | Status: DC | PRN
  Administered 2019-04-25: 20 mL

## 2019-04-25 MED ORDER — PROMETHAZINE HCL 25 MG/ML IJ SOLN: 6.2500 mg | INTRAMUSCULAR | Status: DC | PRN

## 2019-04-25 MED ORDER — SODIUM CHLORIDE 0.9% FLUSH: 3.0000 mL | Freq: Two times a day (BID) | INTRAVENOUS | Status: DC

## 2019-04-25 MED ORDER — LACTATED RINGERS IV SOLN: INTRAVENOUS | Status: DC

## 2019-04-25 MED ORDER — LACTATED RINGERS IR SOLN
Status: DC | PRN
  Administered 2019-04-25: 1000 mL

## 2019-04-25 MED ORDER — DEXAMETHASONE SODIUM PHOSPHATE 10 MG/ML IJ SOLN
INTRAMUSCULAR | Status: DC | PRN
  Administered 2019-04-25: 10 mg via INTRAVENOUS

## 2019-04-25 MED ORDER — DOCUSATE SODIUM 100 MG PO CAPS: 100.0000 mg | ORAL_CAPSULE | Freq: Two times a day (BID) | ORAL | 0 refills | Status: AC

## 2019-04-25 MED ORDER — ACETAMINOPHEN 500 MG PO TABS: 1000.0000 mg | ORAL_TABLET | ORAL | Status: AC

## 2019-04-25 MED ORDER — CEFAZOLIN SODIUM-DEXTROSE 2-4 GM/100ML-% IV SOLN
2.0000 g | INTRAVENOUS | Status: AC
  Administered 2019-04-25: 2 g via INTRAVENOUS
  Filled 2019-04-25: qty 100

## 2019-04-25 SURGICAL SUPPLY — 53 items
APPLIER CLIP 5 13 M/L LIGAMAX5 (MISCELLANEOUS)
BLADE SURG SZ11 CARB STEEL (BLADE) ×4 IMPLANT
CHLORAPREP W/TINT 26 (MISCELLANEOUS) ×4 IMPLANT
CLIP APPLIE 5 13 M/L LIGAMAX5 (MISCELLANEOUS) IMPLANT
CLIP VESOLOCK LG 6/CT PURPLE (CLIP) IMPLANT
COVER TIP SHEARS 8 DVNC (MISCELLANEOUS) ×2 IMPLANT
COVER TIP SHEARS 8MM DA VINCI (MISCELLANEOUS) ×2
COVER WAND RF STERILE (DRAPES) ×4 IMPLANT
DECANTER SPIKE VIAL GLASS SM (MISCELLANEOUS) ×4 IMPLANT
DERMABOND ADVANCED (GAUZE/BANDAGES/DRESSINGS)
DERMABOND ADVANCED .7 DNX12 (GAUZE/BANDAGES/DRESSINGS) IMPLANT
DRAPE ARM DVNC X/XI (DISPOSABLE) ×8 IMPLANT
DRAPE COLUMN DVNC XI (DISPOSABLE) ×2 IMPLANT
DRAPE DA VINCI XI ARM (DISPOSABLE) ×8
DRAPE DA VINCI XI COLUMN (DISPOSABLE) ×2
ELECT REM PT RETURN 15FT ADLT (MISCELLANEOUS) ×4 IMPLANT
GAUZE 4X4 16PLY RFD (DISPOSABLE) ×4 IMPLANT
GAUZE SPONGE 2X2 8PLY STRL LF (GAUZE/BANDAGES/DRESSINGS) IMPLANT
GLOVE BIO SURGEON STRL SZ 6 (GLOVE) ×8 IMPLANT
GLOVE INDICATOR 6.5 STRL GRN (GLOVE) ×8 IMPLANT
GOWN STRL REUS W/TWL LRG LVL3 (GOWN DISPOSABLE) ×8 IMPLANT
GOWN STRL REUS W/TWL XL LVL3 (GOWN DISPOSABLE) IMPLANT
GRASPER SUT TROCAR 14GX15 (MISCELLANEOUS) IMPLANT
IRRIGATOR SUCT 8 DISP DVNC XI (IRRIGATION / IRRIGATOR) IMPLANT
IRRIGATOR SUCTION 8MM XI DISP (IRRIGATION / IRRIGATOR)
KIT BASIN OR (CUSTOM PROCEDURE TRAY) ×4 IMPLANT
KIT PROCEDURE DA VINCI SI (MISCELLANEOUS)
KIT PROCEDURE DVNC SI (MISCELLANEOUS) IMPLANT
KIT TURNOVER KIT A (KITS) IMPLANT
MANIFOLD NEPTUNE II (INSTRUMENTS) ×4 IMPLANT
NEEDLE HYPO 22GX1.5 SAFETY (NEEDLE) ×4 IMPLANT
NEEDLE INSUFFLATION 14GA 120MM (NEEDLE) ×4 IMPLANT
PACK CARDIOVASCULAR III (CUSTOM PROCEDURE TRAY) ×4 IMPLANT
PENCIL SMOKE EVACUATOR (MISCELLANEOUS) IMPLANT
SEAL CANN UNIV 5-8 DVNC XI (MISCELLANEOUS) ×8 IMPLANT
SEAL XI 5MM-8MM UNIVERSAL (MISCELLANEOUS) ×8
SEALER VESSEL DA VINCI XI (MISCELLANEOUS)
SEALER VESSEL EXT DVNC XI (MISCELLANEOUS) IMPLANT
SET IRRIG TUBING LAPAROSCOPIC (IRRIGATION / IRRIGATOR) IMPLANT
SOL ANTI FOG 6CC (MISCELLANEOUS) ×2 IMPLANT
SOLUTION ANTI FOG 6CC (MISCELLANEOUS) ×2
SOLUTION ELECTROLUBE (MISCELLANEOUS) ×4 IMPLANT
SPONGE GAUZE 2X2 STER 10/PKG (GAUZE/BANDAGES/DRESSINGS)
SPONGE LAP 18X18 RF (DISPOSABLE) ×4 IMPLANT
SUT MNCRL AB 4-0 PS2 18 (SUTURE) ×4 IMPLANT
SYR CONTROL 10ML LL (SYRINGE) ×4 IMPLANT
SYS RETRIEVAL 5MM INZII UNIV (BASKET) ×4
SYSTEM RETRIEVL 5MM INZII UNIV (BASKET) ×2 IMPLANT
TOWEL OR 17X26 10 PK STRL BLUE (TOWEL DISPOSABLE) ×4 IMPLANT
TOWEL OR NON WOVEN STRL DISP B (DISPOSABLE) ×4 IMPLANT
TRAY FOLEY MTR SLVR 16FR STAT (SET/KITS/TRAYS/PACK) IMPLANT
TROCAR BLADELESS OPT 5 100 (ENDOMECHANICALS) ×4 IMPLANT
TUBING INSUFFLATION 10FT LAP (TUBING) ×4 IMPLANT

## 2019-04-25 NOTE — Anesthesia Procedure Notes (Signed)
Procedure Name: Intubation Date/Time: 04/25/2019 2:20 PM Performed by: Anne Fu, CRNA Pre-anesthesia Checklist: Patient identified, Emergency Drugs available, Suction available, Patient being monitored and Timeout performed Patient Re-evaluated:Patient Re-evaluated prior to induction Oxygen Delivery Method: Circle system utilized Preoxygenation: Pre-oxygenation with 100% oxygen Ventilation: Mask ventilation without difficulty Laryngoscope Size: Miller and 3 Grade View: Grade I Tube type: Oral Tube size: 7.0 mm Number of attempts: 1 Airway Equipment and Method: Stylet Placement Confirmation: ETT inserted through vocal cords under direct vision,  positive ETCO2 and breath sounds checked- equal and bilateral Secured at: 19 cm Tube secured with: Tape Comments: Placed by Key, CRNA

## 2019-04-25 NOTE — Interval H&P Note (Signed)
History and Physical Interval Note:  04/25/2019 1:24 PM  [name]  has presented today for surgery, with the diagnosis of CHOLECYSTITIS.  The various methods of treatment have been discussed with the patient and family. After consideration of risks, benefits and other options for treatment, the patient has consented to  Procedure(s): XI ROBOTIC [place] (N/A) as a surgical intervention.  The patient's history has been reviewed, patient examined, no change in status, stable for surgery.  I have reviewed the patient's chart and labs.  Questions were answered to the patient's satisfaction.     Rebekah Garcia

## 2019-04-25 NOTE — Transfer of Care (Addendum)
Immediate Anesthesia Transfer of Care Note  Patient: [facility]  Procedure(s) Performed: Procedure(s): LAPAROSCOPIC CHOLECYSTECTOMY (N/A)  Patient Location: PACU  Anesthesia Type:General  Level of Consciousness:  sedated, patient cooperative and responds to stimulation  Airway & Oxygen Therapy:Patient Spontanous Breathing and Patient connected to face mask oxgen  Post-op Assessment:  Report given to PACU RN and Post -op Vital signs reviewed and stable  Post vital signs:  Reviewed and stable  Last Vitals:  Vitals:   04/25/19 1401  BP: 95/60  Pulse: 80  Resp: 16  Temp: 36.9 C  SpO2: [id number]    Complications: No apparent anesthesia complications

## 2019-04-25 NOTE — Anesthesia Postprocedure Evaluation (Signed)
Anesthesia Post Note  Patient: [facility]  Procedure(s) Performed: LAPAROSCOPIC CHOLECYSTECTOMY (N/A Abdomen)     Patient location during evaluation: PACU Anesthesia Type: General Level of consciousness: sedated Pain management: pain level controlled Vital Signs Assessment: post-procedure vital signs reviewed and stable Respiratory status: spontaneous breathing and respiratory function stable Cardiovascular status: stable Postop Assessment: no apparent nausea or vomiting Anesthetic complications: no    Last Vitals:  Vitals:   04/25/19 1545 04/25/19 1600  BP: (!) 102/52 96/64  Pulse: 79 83  Resp: 15 12  Temp:  36.7 C  SpO2: 100% 98%    Last Pain:  Vitals:   04/25/19 1600  TempSrc:   PainSc: 0-No pain                 Clarke Amburn DANIEL

## 2019-04-25 NOTE — Op Note (Signed)
Operative Note  Jelani Hlavaty 48 y.o. female [id number]  04/25/2019  Surgeon: Clovis Riley MD FACS  Assistant: Gurney Maxin MD FACS  Procedure performed: Laparoscopic Cholecystectomy  Preop diagnosis: acute calculous cholecystitis Post-op diagnosis/intraop findings: same  Specimens: gallbladder  Retained items: none  EBL: minimal  Complications: none  Description of procedure: After obtaining informed consent the patient was brought to the operating room. Antibiotics were administered. SCD's were applied. General endotracheal anesthesia was initiated and a formal time-out was performed. The abdomen was prepped and draped in the usual sterile fashion and the abdomen was entered using an infraumbilical veress needle after instilling the site with local. Insufflation to 14mmHg was obtained, 74mm trocar and camera inserted, and gross inspection revealed no evidence of injury from our entry or other intraabdominal abnormalities. Two 69mm trocars were introduced in the right midclavicular and right anterior axillary lines under direct visualization and following infiltration with local. An 26mm trocar was placed in the epigastrium. The gallbladder is pale, with thickened peritoneum and thickened wall consistent with history of calculous cholecystitis. The gallbladder was retracted cephalad and the infundibulum was retracted laterally. A combination of hook electrocautery and blunt dissection was utilized to clear the peritoneum from the neck and cystic duct, circumferentially isolating the cystic artery and cystic duct and lifting the gallbladder from the cystic plate. The critical view of safety was achieved with the cystic artery, cystic duct, and liver bed visualized between them with no other structures. The artery was clipped with a single clip proximally and distally and divided as was the cystic duct with three clips on the proximal end. The gallbladder was dissected from the liver plate  using electrocautery. The gallbladder was quite intrahepatic and the plane between the distal gallbladder and the liver bed was fused. The gallbladder wall was disrupted along the distal body spilling chalky fluid and large pale yellow stones, all of which were retrieved. Once freed the gallbladder was placed in an endocatch bag and removed through the epigastric trocar site. A small amount of bleeding on the liver bed was controlled with cautery. The right upper quadrant was irrigated and aspirated and the effluent was clear. Hemostasis was once again confirmed, and reinspection of the abdomen revealed no injuries. The clips were well opposed without any bile leak from the duct or the liver bed. The 73mm trocar site in the epigastrium was closed with a 0 vicryl in the fascia under direct visualization using a PMI device. The abdomen was desufflated and all trocars removed. The skin incisions were closed with running subcuticular monocryl and Dermabond. The patient was awakened, extubated and transported to the recovery room in stable condition.    All counts were correct at the completion of the case.

## 2019-04-25 NOTE — Discharge Instructions (Signed)
LAPAROSCOPIC SURGERY: POST OP INSTRUCTIONS  ######################################################################  EAT Gradually transition to a high fiber diet with a fiber supplement over the next few weeks after discharge.  Start with a pureed / full liquid diet (see below)  WALK Walk an hour a day.  Control your pain to do that.    CONTROL PAIN Control pain so that you can walk, sleep, tolerate sneezing/coughing, go up/down stairs.  HAVE A BOWEL MOVEMENT DAILY Keep your bowels regular to avoid problems.  OK to try a laxative to override constipation.  OK to use an antidairrheal to slow down diarrhea.  Call if not better after 2 tries  CALL IF YOU HAVE PROBLEMS/CONCERNS Call if you are still struggling despite following these instructions. Call if you have concerns not answered by these instructions  ######################################################################    1. DIET: Follow a light bland diet & liquids the first 24 hours after arrival home, such as soup, liquids, starches, etc.  Be sure to drink plenty of fluids.  Quickly advance to a usual solid diet within a few days.  Avoid fast food or heavy meals as your are more likely to get nauseated or have irregular bowels.  A low-sugar, high-fiber diet for the rest of your life is ideal.  2. Take your usually prescribed home medications unless otherwise directed.  3. PAIN CONTROL: a. Pain is best controlled by a usual combination of three different methods TOGETHER: i. Ice/Heat ii. Over the counter pain medication iii. Prescription pain medication b. Most patients will experience some swelling and bruising around the incisions.  Ice packs or heating pads (30-60 minutes up to 6 times a day) will help. Use ice for the first few days to help decrease swelling and bruising, then switch to heat to help relax tight/sore spots and speed recovery.  Some people prefer to use ice alone, heat alone, alternating between ice & heat.   Experiment to what works for you.  Swelling and bruising can take several weeks to resolve.   c. It is helpful to take an over-the-counter pain medication regularly for the first few days: i. Naproxen (Aleve, etc)  Two 220mg  tabs twice a day OR Ibuprofen (Advil, etc) Three 200mg  tabs four times a day (every meal & bedtime) AND ii. Acetaminophen (Tylenol, etc) 500-650mg  four times a day (every meal & bedtime) d. A  prescription for pain medication (such as oxycodone, hydrocodone, tramadol, gabapentin, methocarbamol, etc) should be given to you upon discharge.  Take your pain medication as prescribed.  i. If you are having problems/concerns with the prescription medicine (does not control pain, nausea, vomiting, rash, itching, etc), please call us [phone] to see if we need to switch you to a different pain medicine that will work better for you and/or control your side effect better. ii. If you need a refill on your pain medication, please give [place] 48 hour notice.  contact your pharmacy.  They will contact our office to request authorization. Prescriptions will not be filled after 5 pm or on week-ends  4. Avoid getting constipated.   a. Between the surgery and the pain medications, it is common to experience some constipation.   b. Increasing fluid intake and taking a fiber supplement (such as Metamucil, Citrucel, FiberCon, MiraLax, etc) 1-2 times a day regularly will usually help prevent this problem from occurring.   c. A mild laxative (prune juice, Milk of Magnesia, MiraLax, etc) should be taken according to package directions if there are no bowel movements after 48  hours.   5. Watch out for diarrhea.   a. If you have many loose bowel movements, simplify your diet to bland foods & liquids for a few days.   b. Stop any stool softeners and decrease your fiber supplement.   c. Switching to mild anti-diarrheal medications (Kayopectate, Pepto Bismol) can help.   d. If this worsens or does not  improve, please call us.  6. Wash / shower every day.  You may shower over the skin glue which is waterproof.  Continue to shower over incision(s) after the dressing is off.  7. Glue will flake off after 2 weeks.  You may leave the incision open to air.  You may replace a dressing/Band-Aid to cover the incision for comfort if you wish.   8. ACTIVITIES as tolerated:   a. You may resume regular (light) daily activities beginning the next day--such as daily self-care, walking, climbing stairs--gradually increasing activities as tolerated.  If you can walk 30 minutes without difficulty, it is safe to try more intense activity such as jogging, treadmill, bicycling, low-impact aerobics, swimming, etc. b. Save the most intensive and strenuous activity for last such as sit-ups, heavy lifting, contact sports, etc  Refrain from any heavy lifting or straining until you are off narcotics for pain control.   c. DO NOT PUSH THROUGH PAIN.  Let pain be your guide: If it hurts to do something, don't do it.  Pain is your body warning you to avoid that activity for another week until the pain goes down. d. You may drive when you are no longer taking prescription pain medication, you can comfortably wear a seatbelt, and you can safely maneuver your car and apply brakes. e. Dennis Bast may have sexual intercourse when it is comfortable.  9. FOLLOW UP in our office a. Please call [facility] at [phone] to set up an appointment to see your surgeon in the office for a follow-up appointment approximately 2-3 weeks after your surgery. b. Make sure that you call for this appointment the day you arrive home to insure a convenient appointment time.  10. IF YOU HAVE DISABILITY OR FAMILY LEAVE FORMS, BRING THEM TO THE OFFICE FOR PROCESSING.  DO NOT GIVE THEM TO YOUR DOCTOR.   WHEN TO CALL us [phone]: 1. Poor pain control 2. Reactions / problems with new medications (rash/itching, nausea, etc)  3. Fever over 101.5 F (38.5  C) 4. Inability to urinate 5. Nausea and/or vomiting 6. Worsening swelling or bruising 7. Continued bleeding from incision. 8. Increased pain, redness, or drainage from the incision   The clinic staff is available to answer your questions during regular business hours (8:30am-5pm).  Please don't hesitate to call and ask to speak to one of our nurses for clinical concerns.   If you have a medical emergency, go to the nearest emergency room or call 911.  A surgeon from [facility] is always on call at the [facility], [place], [place], [place], [place]  [address] ? MAIN: [phone] ? TOLL FREE: [phone] ?  FAX [phone] [email or web address]   General Anesthesia, Adult, Care After This sheet gives you information about how to care for yourself after your procedure. Your health care provider may also give you more specific instructions. If you have problems or questions, contact your health care provider. What can I expect after the procedure? After the procedure, the following side effects are common:  Pain or discomfort at  the IV site.  Nausea.  Vomiting.  Sore throat.  Trouble concentrating.  Feeling cold or chills.  Weak or tired.  Sleepiness and fatigue.  Soreness and body aches. These side effects can affect parts of the body that were not involved in surgery. Follow these instructions at home:  For at least 24 hours after the procedure:  Have a responsible adult stay with you. It is important to have someone help care for you until you are awake and alert.  Rest as needed.  Do not: ? Participate in activities in which you could fall or become injured. ? Drive. ? Use heavy machinery. ? Drink alcohol. ? Take sleeping pills or medicines that cause drowsiness. ? Make important decisions or sign legal documents. ? Take care of children on your own. Eating and drinking  Follow any  instructions from your health care provider about eating or drinking restrictions.  When you feel hungry, start by eating small amounts of foods that are soft and easy to digest (bland), such as toast. Gradually return to your regular diet.  Drink enough fluid to keep your urine pale yellow.  If you vomit, rehydrate by drinking water, juice, or clear broth. General instructions  If you have sleep apnea, surgery and certain medicines can increase your risk for breathing problems. Follow instructions from your health care provider about wearing your sleep device: ? Anytime you are sleeping, including during daytime naps. ? While taking prescription pain medicines, sleeping medicines, or medicines that make you drowsy.  Return to your normal activities as told by your health care provider. Ask your health care provider what activities are safe for you.  Take over-the-counter and prescription medicines only as told by your health care provider.  If you smoke, do not smoke without supervision.  Keep all follow-up visits as told by your health care provider. This is important. Contact a health care provider if:  You have nausea or vomiting that does not get better with medicine.  You cannot eat or drink without vomiting.  You have pain that does not get better with medicine.  You are unable to pass urine.  You develop a skin rash.  You have a fever.  You have redness around your IV site that gets worse. Get help right away if:  You have difficulty breathing.  You have chest pain.  You have blood in your urine or stool, or you vomit blood. Summary  After the procedure, it is common to have a sore throat or nausea. It is also common to feel tired.  Have a responsible adult stay with you for the first 24 hours after general anesthesia. It is important to have someone help care for you until you are awake and alert.  When you feel hungry, start by eating small amounts of foods  that are soft and easy to digest (bland), such as toast. Gradually return to your regular diet.  Drink enough fluid to keep your urine pale yellow.  Return to your normal activities as told by your health care provider. Ask your health care provider what activities are safe for you. This information is not intended to replace advice given to you by your health care provider. Make sure you discuss any questions you have with your health care provider. Document Released: 08/03/2000 Document Revised: 04/30/2017 Document Reviewed: 12/11/2016 Elsevier Patient Education  2020 [employer].

## 2019-04-25 NOTE — Anesthesia Preprocedure Evaluation (Addendum)
Anesthesia Evaluation  Patient identified by MRN, date of birth, ID band Patient awake    Reviewed: Allergy & Precautions, NPO status , Patient's Chart, lab work & pertinent test results  History of Anesthesia Complications Negative for: history of anesthetic complications  Airway Mallampati: II  TM Distance: >3 FB Neck ROM: Full    Dental no notable dental hx. (+) Dental Advisory Given   Pulmonary neg pulmonary ROS,    Pulmonary exam normal        Cardiovascular negative cardio ROS Normal cardiovascular exam     Neuro/Psych negative neurological ROS     GI/Hepatic Neg liver ROS,   Endo/Other  negative endocrine ROS  Renal/GU negative Renal ROS     Musculoskeletal negative musculoskeletal ROS (+)   Abdominal   Peds  Hematology negative hematology ROS (+)   Anesthesia Other Findings Day of surgery medications reviewed with the patient.  Reproductive/Obstetrics                            Anesthesia Physical Anesthesia Plan  ASA: II  Anesthesia Plan: General   Post-op Pain Management:    Induction: Intravenous  PONV Risk Score and Plan: 4 or greater and Ondansetron, Dexamethasone, Scopolamine patch - Pre-op and Midazolam  Airway Management Planned: Oral ETT  Additional Equipment:   Intra-op Plan:   Post-operative Plan: Extubation in OR  Informed Consent: I have reviewed the patients History and Physical, chart, labs and discussed the procedure including the risks, benefits and alternatives for the proposed anesthesia with the patient or authorized representative who has indicated his/her understanding and acceptance.     Dental advisory given  Plan Discussed with: Anesthesiologist and CRNA  Anesthesia Plan Comments:        Anesthesia Quick Evaluation

## 2019-04-26 MED ORDER — SUGAMMADEX SODIUM 200 MG/2ML IV SOLN
INTRAVENOUS | Status: DC | PRN
  Administered 2019-04-25: 121.6 mg via INTRAVENOUS

## 2019-04-27 LAB — SURGICAL PATHOLOGY

## 2021-10-02 IMAGING — US US ABDOMEN LIMITED
1 series · 14 of 25 positions shown · non-contrast
Comparison: CT abdomen and pelvis April 09, 2019

CLINICAL DATA: Pain with nausea and vomiting

EXAM:
ULTRASOUND ABDOMEN LIMITED RIGHT UPPER QUADRANT

[Series 1: us abdomen limited · 14 of 42 slices shown]
[im 1/42]
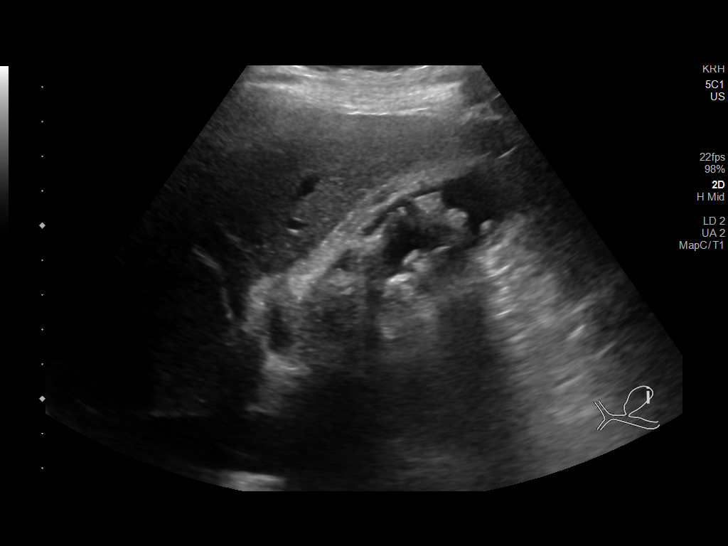
[im 4/42]
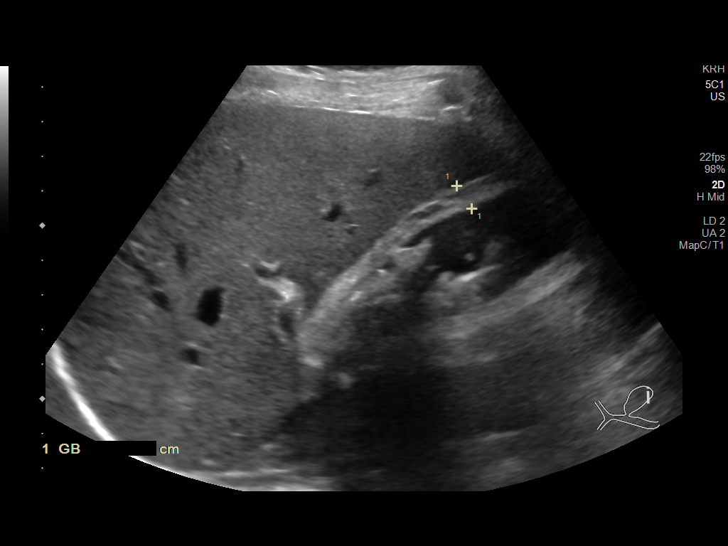
[im 7/42]
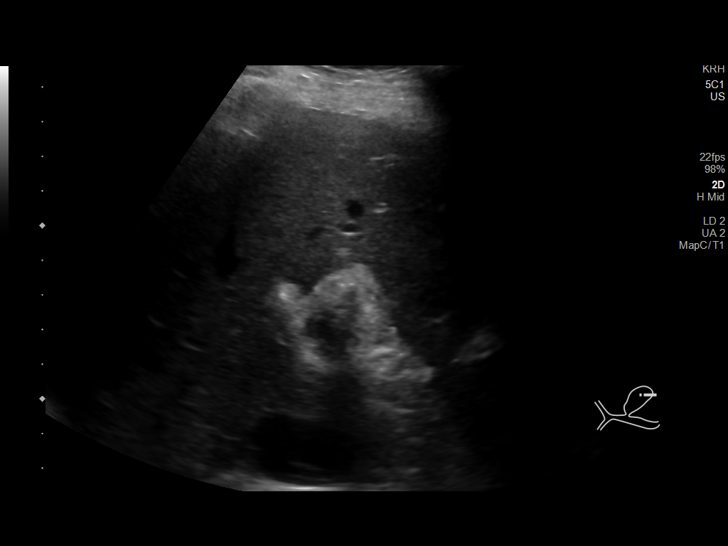
[im 11/42]
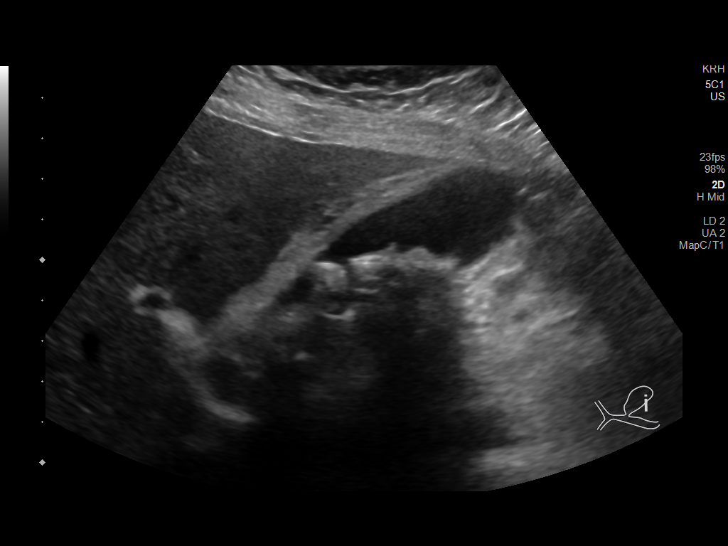
[im 14/42]
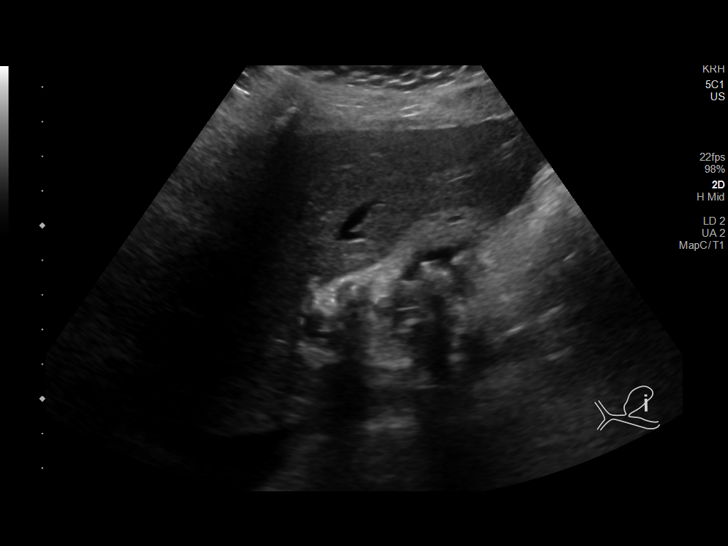
[im 16/42]
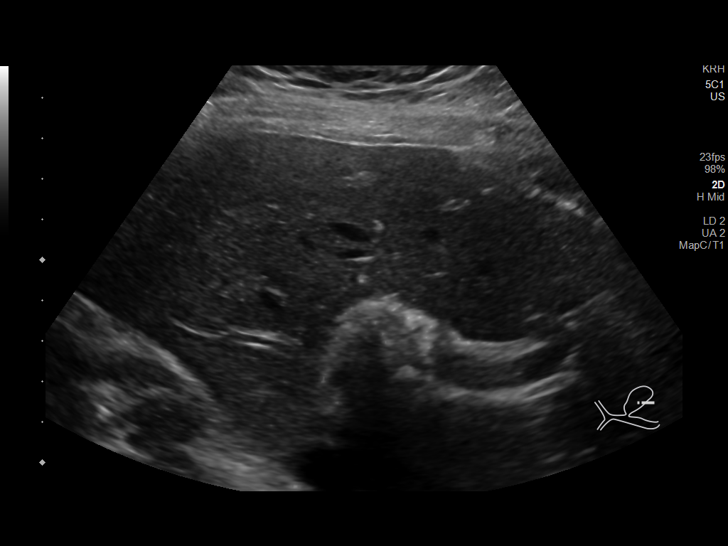
[im 19/42]
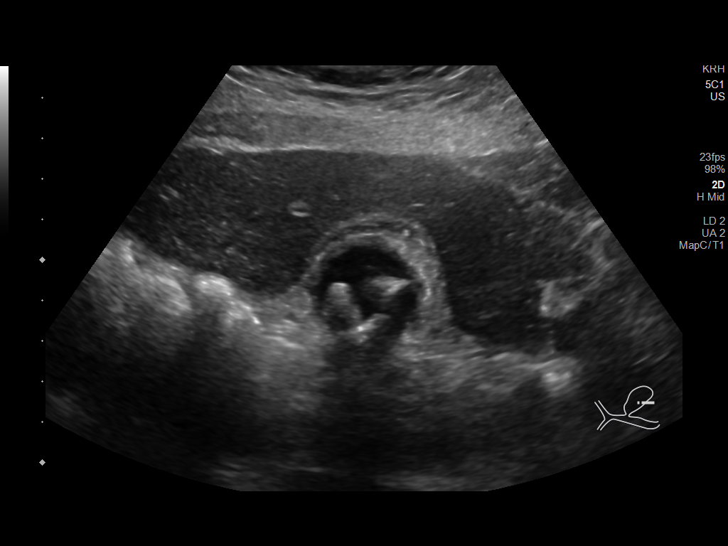
[im 23/42]
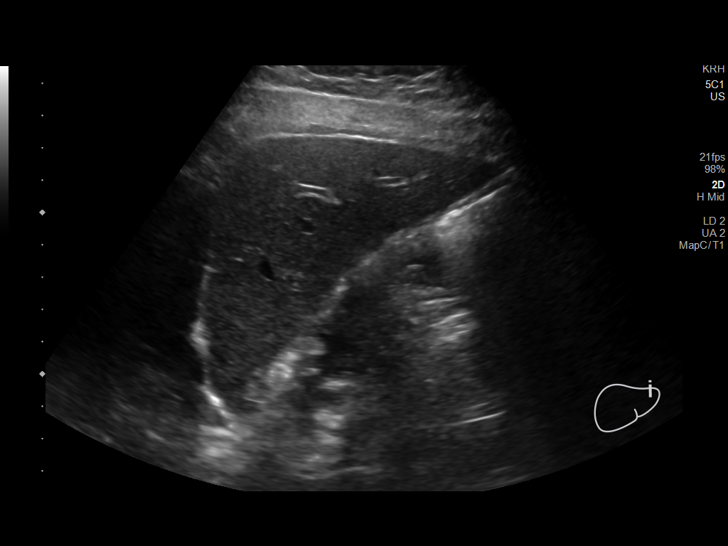
[im 26/42]
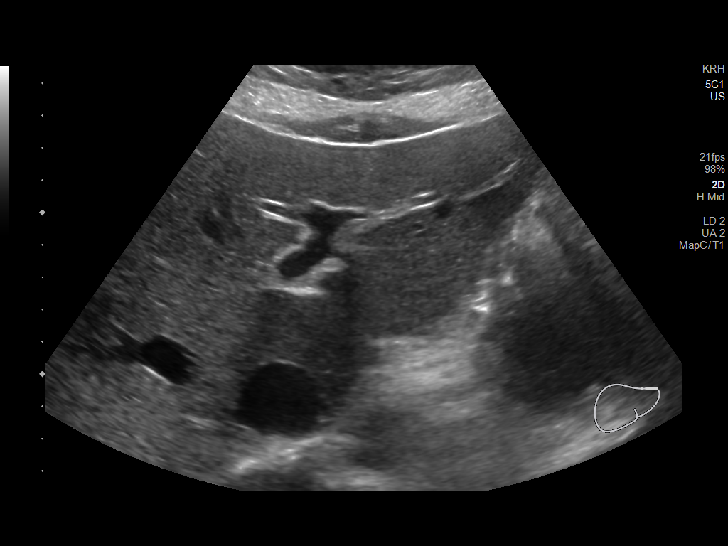
[im 28/42]
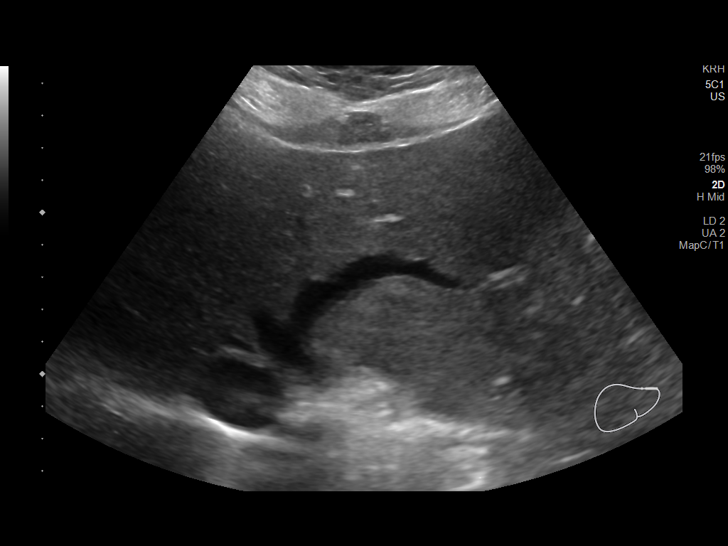
[im 31/42]
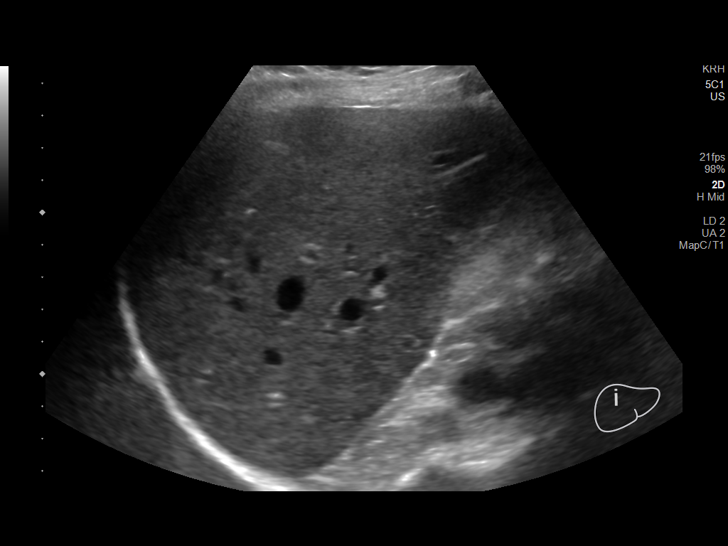
[im 35/42]
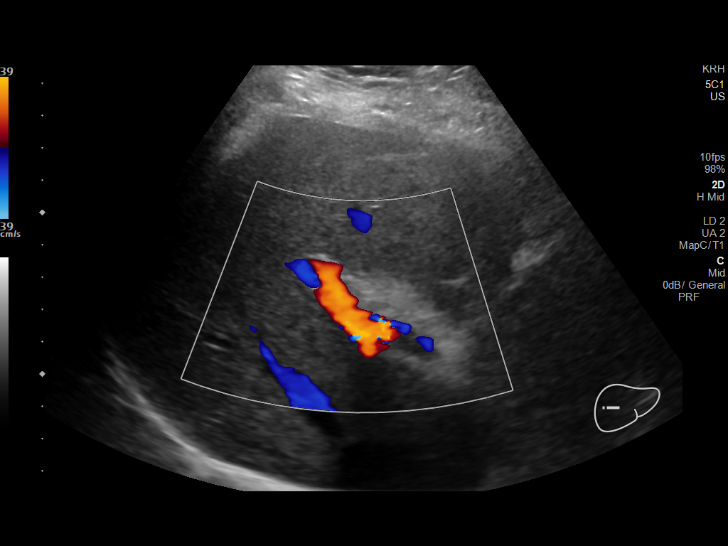
[im 38/42]
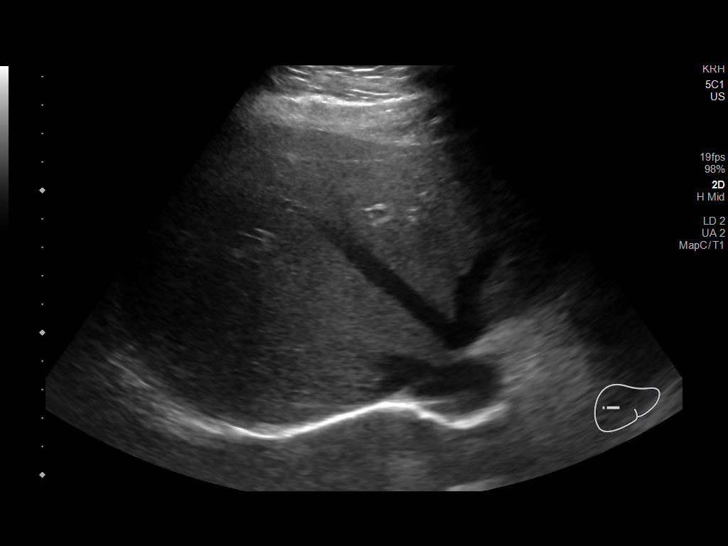
[im 42/42]
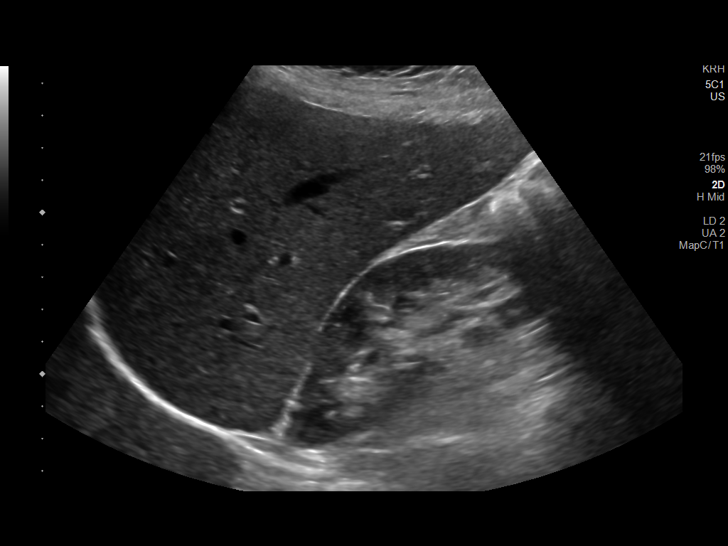

[14 of 25 positions shown; findings below may reference images not displayed]

FINDINGS: Gallbladder:

Within the gallbladder there are echogenic foci which move and
shadow consistent with cholelithiasis. Largest gallstone measures
1.5 cm in length. There is gallbladder wall thickening and mild
pericholecystic fluid. Patient is tender over the gallbladder.

Common bile duct:

Diameter: 3 mm. No intrahepatic or extrahepatic biliary duct
dilatation.

Liver:

No focal lesion identified. Within normal limits in parenchymal
echogenicity. Portal vein is patent on color Doppler imaging with
normal direction of blood flow towards the liver.

Other: None.
IMPRESSION: Findings indicative of acute cholecystitis. Study otherwise
unremarkable.

## 2021-10-02 IMAGING — CT CT ABD-PELV W/ CM
2 of 5 series · 15 of 46 positions shown, 17 images · IV contrast (Omnipaque)
Comparison: None.

CLINICAL DATA: Epigastric pain with nausea and vomiting

EXAM:
CT ABDOMEN AND PELVIS WITH CONTRAST
TECHNIQUE: Multidetector CT imaging of the abdomen and pelvis was performed
using the standard protocol following bolus administration of
intravenous contrast.
CONTRAST:  100mL OMNIPAQUE IOHEXOL 300 MG/ML  SOLN

[Series 2: axial st · axial · 0.80mm/px · z∈[-403,+32]mm · 12 of 99 slices shown, 14 images]
[im 6/99  soft-tissue]
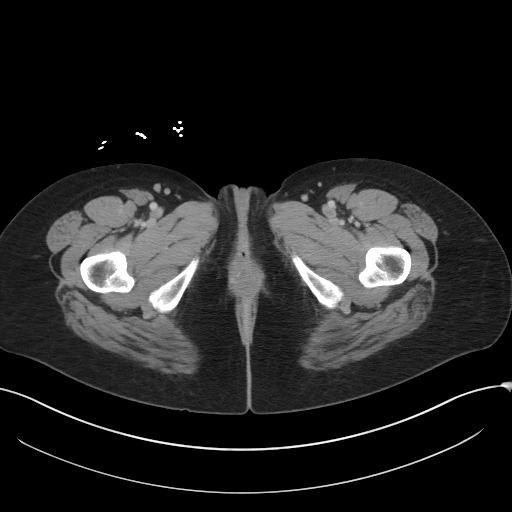
[im 6/99  bone]
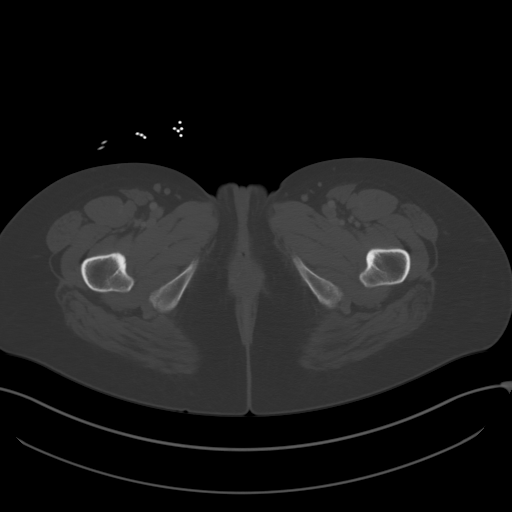
[im 17/99  soft-tissue]
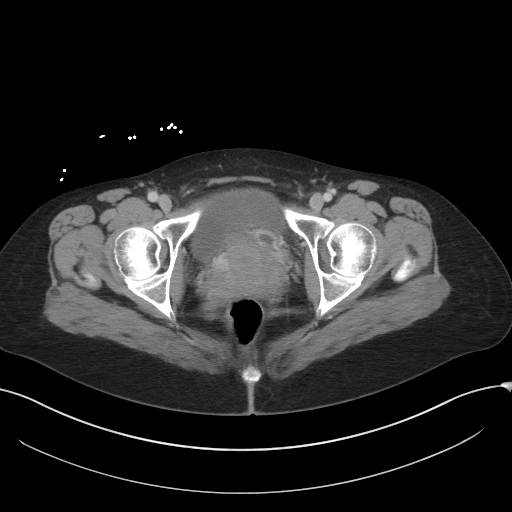
[im 22/99  soft-tissue]
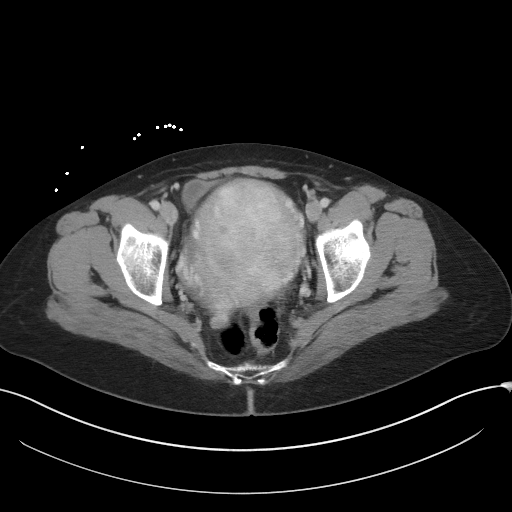
[im 28/99  soft-tissue]
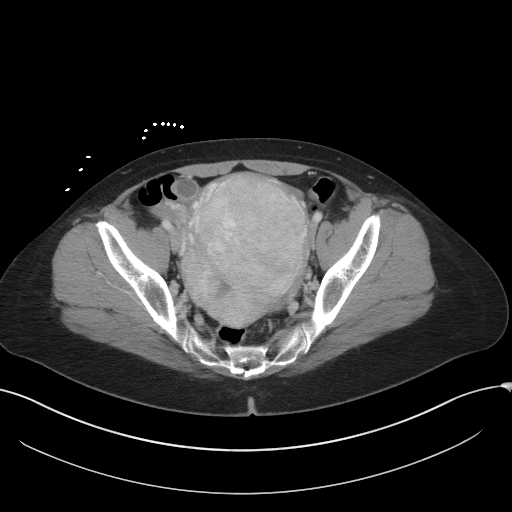
[im 39/99  soft-tissue]
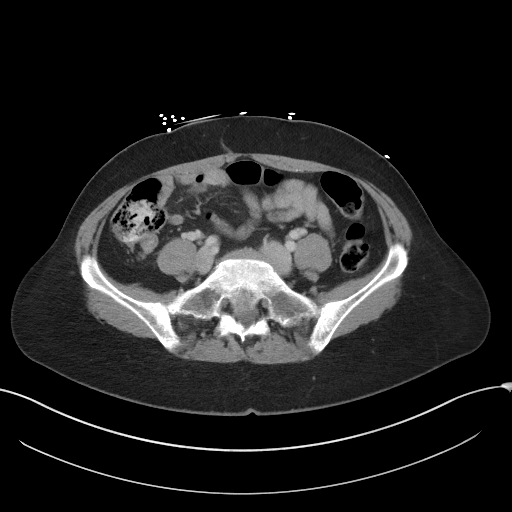
[im 44/99  soft-tissue]
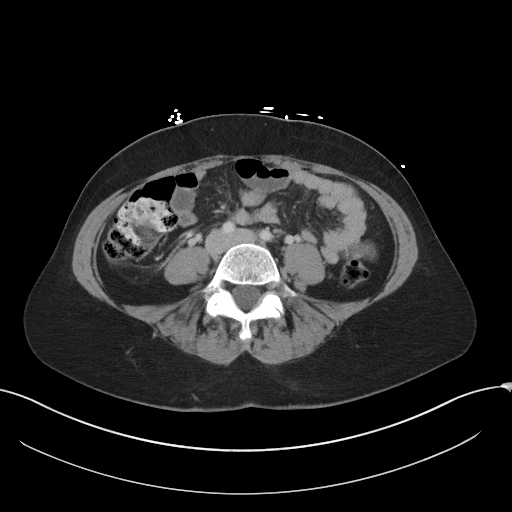
[im 55/99  soft-tissue]
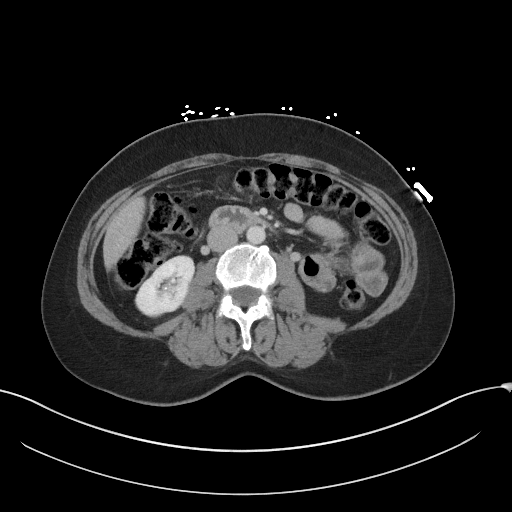
[im 60/99  soft-tissue]
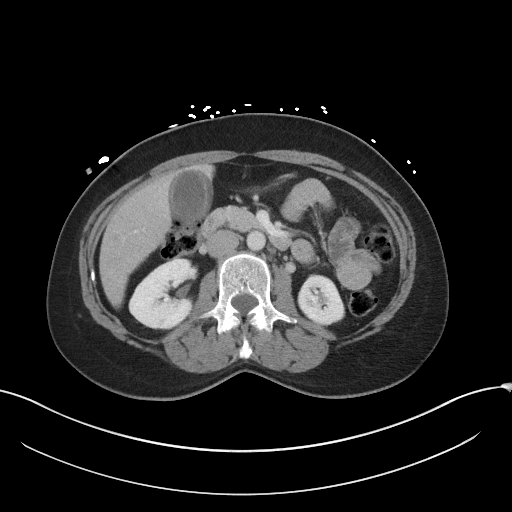
[im 71/99  soft-tissue]
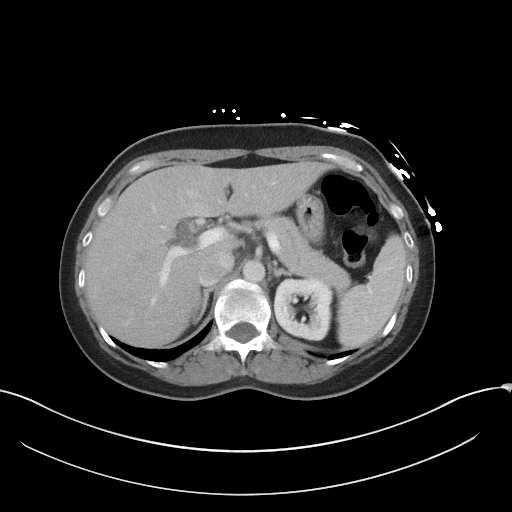
[im 71/99  bone]
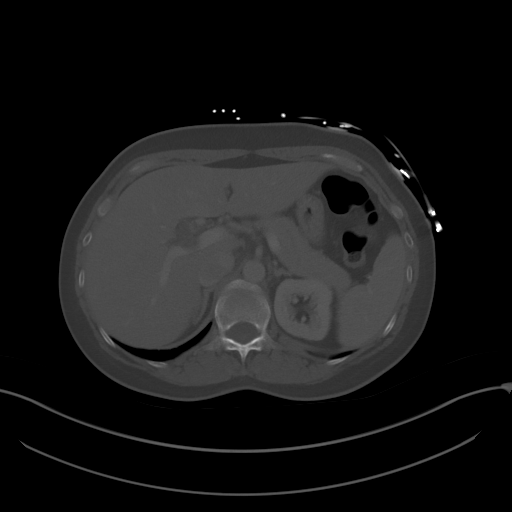
[im 77/99  soft-tissue]
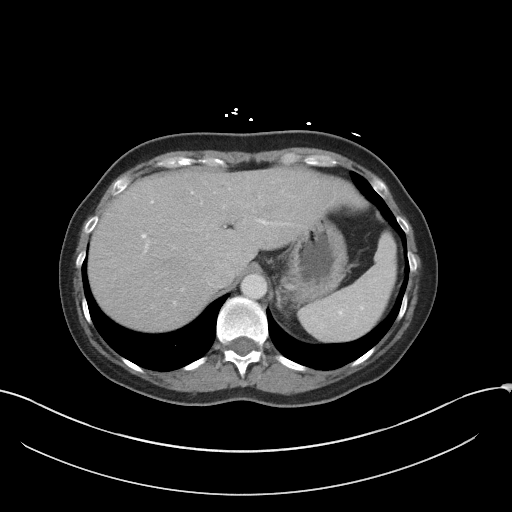
[im 82/99  soft-tissue]
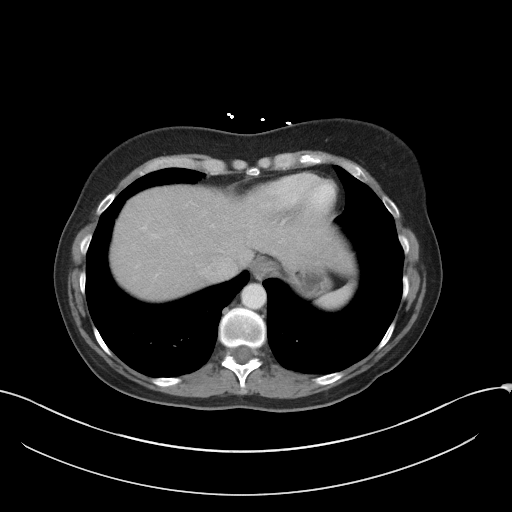
[im 93/99  soft-tissue]
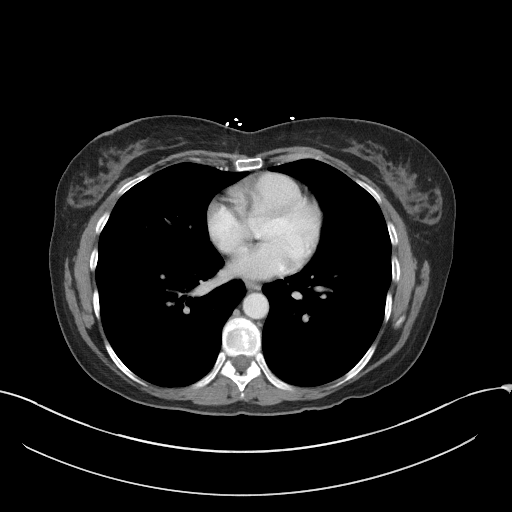

[Series 5: coronal st · coronal · 0.84mm/px · 3 of 98 slices shown]
[im 33/98  soft-tissue]
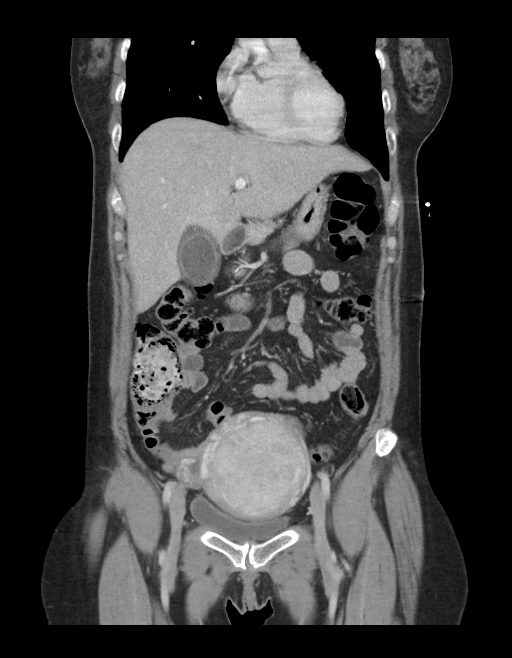
[im 44/98  soft-tissue]
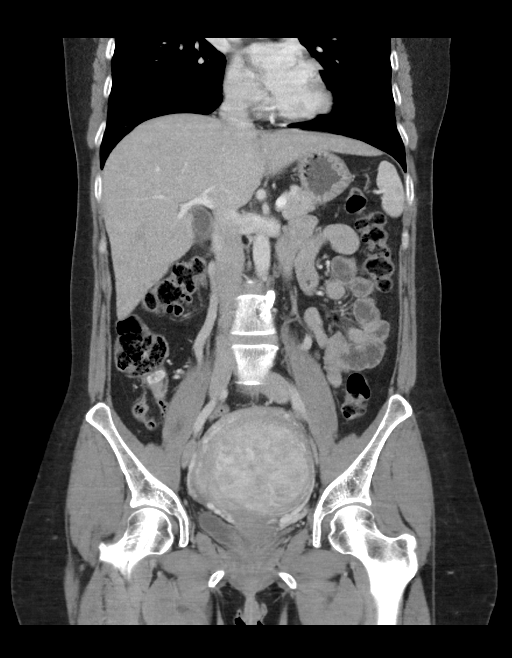
[im 54/98  soft-tissue]
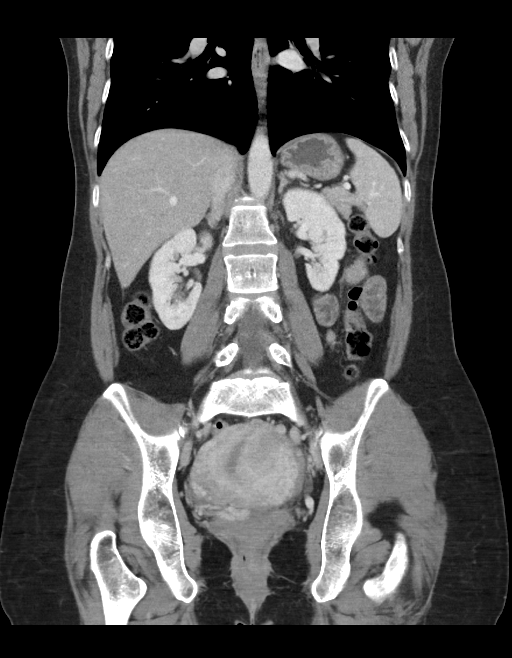

[15 of 46 positions shown; findings below may reference images not displayed]

FINDINGS: Lower chest: Lung bases are clear.

Hepatobiliary: No focal liver lesions are evident. The gallbladder
wall appears thickened with apparent pericholecystic fluid. No
gallstones are appreciable by CT. There is no appreciable biliary
duct dilatation.

Pancreas: There is no pancreatic mass or inflammatory focus.

Spleen: No splenic lesions are evident.

Adrenals/Urinary Tract: Adrenals bilaterally appear normal. There is
a cyst in the upper pole of the left kidney measuring 1.5 x 1.3 cm.
There is a 5 mm cyst in the posterior mid right kidney. There is a 7
mm cyst in the lower pole left kidney. There is no appreciable
hydronephrosis on either side. There is a 1 mm calculus in the lower
pole of the right kidney. There is no appreciable ureteral calculus
on either side. Urinary bladder is midline with wall thickness
within normal limits.

Stomach/Bowel: There is no appreciable bowel wall or mesenteric
thickening. Terminal ileum appears normal. There is no evident bowel
obstruction. There is no free air or portal venous air.

Vascular/Lymphatic: There is no abdominal aortic aneurysm. No
vascular lesions are evident. There is no adenopathy in the abdomen
or pelvis.

Reproductive: Uterus is anteverted. Uterus is enlarged diffusely
measuring 12.3 x 9.7 x 10.8 cm. There are mass lesions throughout
the uterus consistent with diffuse leiomyomatous change. There is
prominent periuterine vascularity. There is a follicle in the right
ovary measuring 1.7 x 1.6 cm. Beyond this follicle, there is no
extrauterine pelvic mass.

Other: Appendix appears normal. No evident abscess or ascites in the
abdomen or pelvis.

Musculoskeletal: There is a hemangioma in the L4 vertebral body. No
blastic or lytic bone lesions evident. No intramuscular or abdominal
wall lesion.
IMPRESSION: 1. Apparent gallbladder wall thickening and pericholecystic fluid. A
degree of cholecystitis is suspected. Correlation with gallbladder
ultrasound advised.

2. Enlarged uterus with diffuse leiomyomatous change within the
uterus.

3. Prominent periuterine vascularity. Question a degree of pelvic
congestion syndrome.

4. Nonobstructing 1 mm calculus lower pole left kidney. No
hydronephrosis or ureteral calculus on either side. Urinary bladder
wall thickness normal.

5. Appendix appears normal. No bowel obstruction. No abscess in the
abdomen or pelvis.

## 2021-11-04 ENCOUNTER — Encounter (HOSPITAL_BASED_OUTPATIENT_CLINIC_OR_DEPARTMENT_OTHER): Payer: Self-pay | Admitting: Urology

## 2021-11-04 ENCOUNTER — Emergency Department (HOSPITAL_BASED_OUTPATIENT_CLINIC_OR_DEPARTMENT_OTHER): Payer: 59

## 2021-11-04 ENCOUNTER — Emergency Department (HOSPITAL_BASED_OUTPATIENT_CLINIC_OR_DEPARTMENT_OTHER)
Admission: EM | Admit: 2021-11-04 | Discharge: 2021-11-05 | Disposition: A | Discharge status: Home / Self Care | Payer: 59 | Attending: Emergency Medicine | Admitting: Emergency Medicine

## 2021-11-04 ENCOUNTER — Other Ambulatory Visit: Payer: Self-pay

## 2021-11-04 DIAGNOSIS — E876 Hypokalemia: Secondary | ICD-10-CM | POA: Insufficient documentation

## 2021-11-04 DIAGNOSIS — R1013 Epigastric pain: Secondary | ICD-10-CM | POA: Diagnosis present

## 2021-11-04 DIAGNOSIS — R7401 Elevation of levels of liver transaminase levels: Secondary | ICD-10-CM | POA: Diagnosis not present

## 2021-11-04 LAB — URINALYSIS, ROUTINE W REFLEX MICROSCOPIC
Bilirubin Urine: NEGATIVE
Glucose, UA: NEGATIVE mg/dL
Hgb urine dipstick: NEGATIVE
Ketones, ur: NEGATIVE mg/dL
Nitrite: POSITIVE — AB
Protein, ur: NEGATIVE mg/dL
Specific Gravity, Urine: 1.02 (ref 1.005–1.030)
pH: 7.5 (ref 5.0–8.0)

## 2021-11-04 LAB — CBC
HCT: 40.6 % (ref 36.0–46.0)
Hemoglobin: 13.8 g/dL (ref 12.0–15.0)
MCH: 28.9 pg (ref 26.0–34.0)
MCHC: 34 g/dL (ref 30.0–36.0)
MCV: 85.1 fL (ref 80.0–100.0)
Platelets: 241 10*3/uL (ref 150–400)
RBC: 4.77 MIL/uL (ref 3.87–5.11)
RDW: 12.5 % (ref 11.5–15.5)
WBC: 7.6 10*3/uL (ref 4.0–10.5)
nRBC: 0 % (ref 0.0–0.2)

## 2021-11-04 LAB — URINALYSIS, MICROSCOPIC (REFLEX)

## 2021-11-04 LAB — COMPREHENSIVE METABOLIC PANEL
ALT: 116 U/L — ABNORMAL HIGH (ref 0–44)
AST: 253 U/L — ABNORMAL HIGH (ref 15–41)
Albumin: 4.1 g/dL (ref 3.5–5.0)
Alkaline Phosphatase: 104 U/L (ref 38–126)
Anion gap: 9 (ref 5–15)
BUN: 22 mg/dL — ABNORMAL HIGH (ref 6–20)
CO2: 24 mmol/L (ref 22–32)
Calcium: 9.3 mg/dL (ref 8.9–10.3)
Chloride: 104 mmol/L (ref 98–111)
Creatinine, Ser: 0.77 mg/dL (ref 0.44–1.00)
GFR, Estimated: >60 mL/min (ref >60)
Glucose, Bld: 121 mg/dL — ABNORMAL HIGH (ref 70–99)
Potassium: 3.2 mmol/L — ABNORMAL LOW (ref 3.5–5.1)
Sodium: 137 mmol/L (ref 135–145)
Total Bilirubin: 0.9 mg/dL (ref 0.3–1.2)
Total Protein: 7.3 g/dL (ref 6.5–8.1)

## 2021-11-04 LAB — PREGNANCY, URINE: Preg Test, Ur: NEGATIVE

## 2021-11-04 LAB — LIPASE, BLOOD: Lipase: 35 U/L (ref 11–51)

## 2021-11-04 NOTE — ED Provider Notes (Signed)
[facility] Provider Note   CSN: [id number] Arrival date & time: 11/04/21  2119     History  Chief Complaint  Patient presents with   Abdominal Pain    Rebekah Garcia is a 51 y.o. female.  Patient with history of cholecystectomy in 2020 presents to the emergency department today for evaluation of epigastric pain.  Patient states that her symptoms tonight were very sharp in nature, starting around 8:00.  She last ate around 4:00.  Symptoms gradually improved and resolved prior to arrival over the course of 1 to 2 hours.  Patient states that her symptoms were reminiscent of a "gallbladder attack".  She denies chest pain or shortness of breath.  No fevers or vomiting.  She tried taking Gas-X without improvement.  She had a history of a hysterectomy last year, otherwise no abdominal surgeries.  No hematuria or irritative UTI symptoms including dysuria, increased frequency or urgency.          Home Medications Prior to Admission medications   Medication Sig Start Date End Date Taking? Authorizing Provider  Multiple Vitamin (MULTIVITAMIN) tablet Take 1 tablet by mouth daily.    [provider]  ondansetron (ZOFRAN) 4 MG tablet Take 1 tablet (4 mg total) by mouth every 8 (eight) hours as needed for nausea or vomiting. 04/09/19   Hayden Rasmussen, MD      Allergies    Octacaine    Review of Systems   Review of Systems  Physical Exam Updated Vital Signs BP 112/72 (BP Location: Right Arm)   Pulse 81   Temp 98 F (36.7 C) (Oral)   Resp 18   Ht '5\' 4"'$  (1.626 m)   Wt 63.5 kg   LMP 04/13/2019   SpO2 99%   BMI 24.03 kg/m   Physical Exam Vitals and nursing note reviewed.  Constitutional:      General: She is not in acute distress.    Appearance: She is well-developed.  HENT:     Head: Normocephalic and atraumatic.     Right Ear: External ear normal.     Left Ear: External ear normal.     Nose: Nose normal.  Eyes:      Conjunctiva/sclera: Conjunctivae normal.  Cardiovascular:     Rate and Rhythm: Normal rate and regular rhythm.     Heart sounds: No murmur heard. Pulmonary:     Effort: No respiratory distress.     Breath sounds: No wheezing, rhonchi or rales.  Abdominal:     Palpations: Abdomen is soft.     Tenderness: There is no abdominal tenderness. There is no guarding or rebound.  Musculoskeletal:     Cervical back: Normal range of motion and neck supple.     Right lower leg: No edema.     Left lower leg: No edema.  Skin:    General: Skin is warm and dry.     Findings: No rash.  Neurological:     General: No focal deficit present.     Mental Status: She is alert. Mental status is at baseline.     Motor: No weakness.  Psychiatric:        Mood and Affect: Mood normal.     ED Results / Procedures / Treatments   Labs (all labs ordered are listed, but only abnormal results are displayed) Labs Reviewed  COMPREHENSIVE METABOLIC PANEL - Abnormal; Notable for the following components:      Result Value   Potassium 3.2 (*)  Glucose, Bld 121 (*)    BUN 22 (*)    AST 253 (*)    ALT 116 (*)    All other components within normal limits  URINALYSIS, ROUTINE W REFLEX MICROSCOPIC - Abnormal; Notable for the following components:   APPearance CLOUDY (*)    Nitrite POSITIVE (*)    Leukocytes,Ua MODERATE (*)    All other components within normal limits  URINALYSIS, MICROSCOPIC (REFLEX) - Abnormal; Notable for the following components:   Bacteria, UA MANY (*)    All other components within normal limits  LIPASE, BLOOD  CBC  PREGNANCY, URINE    EKG None  Radiology No results found.  Procedures Procedures    Medications Ordered in ED Medications - No data to display  ED Course/ Medical Decision Making/ A&P    Patient seen and examined. History obtained directly from patient. Work-up including labs, imaging, EKG ordered in triage, if performed, were reviewed.    Labs/EKG:  Independently reviewed and interpreted.  This included: CBC unremarkable; CMP with elevated transaminases AST 253, ALT 116 with normal bilirubin and normal alkaline phosphatase, mild hypokalemia at 3.2, glucose 121; normal lipase; UA with positive nitrite, 11-20 white blood cells.   Imaging: Ordered right upper quadrant ultrasound to evaluate for any signs of biliary duct dilation.  Medications/Fluids: None ordered as patient is currently asymptomatic  Most recent vital signs reviewed and are as follows: BP 112/72 (BP Location: Right Arm)   Pulse 81   Temp 98 F (36.7 C) (Oral)   Resp 18   Ht '5\' 4"'$  (1.626 m)   Wt 63.5 kg   LMP 04/13/2019   SpO2 99%   BMI 24.03 kg/m   Initial impression: Epigastric pain, resolved with mild elevation in transaminases, asymptomatic bacturia  12:03 AM Signout to Dr. Betsey Holiday at shift change pending [place] results.                             Medical Decision Making Amount and/or Complexity of Data Reviewed Labs: ordered. Radiology: ordered.   For this patient's complaint of abdominal pain, the following conditions were considered on the differential diagnosis: gastritis/PUD, enteritis/duodenitis, appendicitis, cholelithiasis/cholecystitis, cholangitis, pancreatitis, ruptured viscus, colitis, diverticulitis, small/large bowel obstruction, proctitis, cystitis, pyelonephritis, ureteral colic, aortic dissection, aortic aneurysm. In women, ectopic pregnancy, pelvic inflammatory disease, ovarian cysts, and tubo-ovarian abscess were also considered. Atypical chest etiologies were also considered including ACS, PE, and pneumonia.   The patient's vital signs, pertinent lab work and imaging were reviewed and interpreted as discussed in the ED course. Hospitalization was considered for further testing, treatments, or serial exams/observation. However as patient is well-appearing, has a stable exam, and reassuring studies today, I do not feel that they warrant admission  at this time. This plan was discussed with the patient who verbalizes agreement and comfort with this plan and seems reliable and able to return to the Emergency Department with worsening or changing symptoms.          Final Clinical Impression(s) / ED Diagnoses Final diagnoses:  Elevated transaminase level  Epigastric abdominal pain    Rx / DC Orders ED Discharge Orders     None         Carlisle Cater, PA-C 11/05/21 0003    Wyvonnia Dusky, MD 11/05/21 [phone]

## 2021-11-05 MED ORDER — HYOSCYAMINE SULFATE 0.125 MG SL SUBL: 0.1250 mg | SUBLINGUAL_TABLET | SUBLINGUAL | 0 refills | Status: AC | PRN

## 2021-11-05 MED ORDER — SUCRALFATE 1 G PO TABS: 1.0000 g | ORAL_TABLET | Freq: Three times a day (TID) | ORAL | 0 refills | Status: AC

## 2021-11-05 MED ORDER — PANTOPRAZOLE SODIUM 40 MG PO TBEC: 40.0000 mg | DELAYED_RELEASE_TABLET | Freq: Every day | ORAL | 0 refills | Status: AC

## 2021-11-05 NOTE — ED Provider Notes (Signed)
Patient signed out to me to follow-up on ultrasound.  Patient seen with abdominal discomfort and found to have mildly elevated transaminases.  Patient has had prior cholecystectomy.  Ultrasound was performed to evaluate for possible choledocholithiasis.  No stone is seen, but common bile duct caliber is normal.  Doubt choledocholithiasis.  Patient is appropriate for outpatient follow-up with GI to further evaluate.   Orpah Greek, MD 11/05/21 0157

## 2021-11-05 NOTE — ED Notes (Signed)
Patient ambulated to restroom with steady gait.

## 2021-11-05 NOTE — Discharge Instructions (Addendum)
Please follow-up with your doctor in the next 3 days for recheck or the GI listed for further evaluation of your elevated liver enzymes.
